# Patient Record
Sex: Male | Born: 1950 | Race: Black or African American | Hispanic: No | Marital: Married | Smoking: Former smoker
Health system: Southern US, Community
[De-identification: ages and names within clinical notes are randomized; demographics above are authoritative.]

## PROBLEM LIST (undated history)

## (undated) DIAGNOSIS — H269 Unspecified cataract: Secondary | ICD-10-CM

## (undated) DIAGNOSIS — T7840XA Allergy, unspecified, initial encounter: Secondary | ICD-10-CM

## (undated) DIAGNOSIS — I10 Essential (primary) hypertension: Secondary | ICD-10-CM

## (undated) DIAGNOSIS — E119 Type 2 diabetes mellitus without complications: Secondary | ICD-10-CM

## (undated) DIAGNOSIS — H409 Unspecified glaucoma: Secondary | ICD-10-CM

## (undated) HISTORY — DX: Unspecified cataract: H26.9

## (undated) HISTORY — DX: Unspecified glaucoma: H40.9

## (undated) HISTORY — DX: Allergy, unspecified, initial encounter: T78.40XA

---

## 2016-07-03 DIAGNOSIS — E782 Mixed hyperlipidemia: Secondary | ICD-10-CM | POA: Insufficient documentation

## 2018-04-18 ENCOUNTER — Other Ambulatory Visit: Payer: Self-pay

## 2018-04-18 ENCOUNTER — Emergency Department (HOSPITAL_BASED_OUTPATIENT_CLINIC_OR_DEPARTMENT_OTHER): Payer: Medicare HMO

## 2018-04-18 ENCOUNTER — Observation Stay (HOSPITAL_BASED_OUTPATIENT_CLINIC_OR_DEPARTMENT_OTHER)
Admission: EM | Admit: 2018-04-18 | Discharge: 2018-04-18 | Disposition: A | Discharge status: Other Acute Inpatient Hospital | Payer: Medicare HMO | Attending: Emergency Medicine | Admitting: Emergency Medicine

## 2018-04-18 ENCOUNTER — Encounter (HOSPITAL_BASED_OUTPATIENT_CLINIC_OR_DEPARTMENT_OTHER): Payer: Self-pay | Admitting: *Deleted

## 2018-04-18 DIAGNOSIS — I10 Essential (primary) hypertension: Secondary | ICD-10-CM | POA: Insufficient documentation

## 2018-04-18 DIAGNOSIS — Z87891 Personal history of nicotine dependence: Secondary | ICD-10-CM | POA: Diagnosis not present

## 2018-04-18 DIAGNOSIS — I441 Atrioventricular block, second degree: Secondary | ICD-10-CM | POA: Diagnosis not present

## 2018-04-18 DIAGNOSIS — Z880 Allergy status to penicillin: Secondary | ICD-10-CM | POA: Insufficient documentation

## 2018-04-18 DIAGNOSIS — Z7984 Long term (current) use of oral hypoglycemic drugs: Secondary | ICD-10-CM | POA: Diagnosis not present

## 2018-04-18 DIAGNOSIS — E119 Type 2 diabetes mellitus without complications: Secondary | ICD-10-CM | POA: Insufficient documentation

## 2018-04-18 DIAGNOSIS — R1031 Right lower quadrant pain: Secondary | ICD-10-CM | POA: Diagnosis present

## 2018-04-18 DIAGNOSIS — Z79899 Other long term (current) drug therapy: Secondary | ICD-10-CM | POA: Insufficient documentation

## 2018-04-18 DIAGNOSIS — R001 Bradycardia, unspecified: Secondary | ICD-10-CM | POA: Diagnosis present

## 2018-04-18 DIAGNOSIS — N452 Orchitis: Secondary | ICD-10-CM | POA: Diagnosis not present

## 2018-04-18 HISTORY — DX: Essential (primary) hypertension: I10

## 2018-04-18 HISTORY — DX: Type 2 diabetes mellitus without complications: E11.9

## 2018-04-18 LAB — BASIC METABOLIC PANEL
ANION GAP: 5 (ref 5–15)
BUN: 11 mg/dL (ref 8–23)
CO2: 29 mmol/L (ref 22–32)
Calcium: 9.6 mg/dL (ref 8.9–10.3)
Chloride: 103 mmol/L (ref 98–111)
Creatinine, Ser: 1.11 mg/dL (ref 0.61–1.24)
GFR calc Af Amer: >60 mL/min (ref >60)
GFR calc non Af Amer: >60 mL/min (ref >60)
Glucose, Bld: 113 mg/dL — ABNORMAL HIGH (ref 70–99)
Potassium: 3.9 mmol/L (ref 3.5–5.1)
Sodium: 137 mmol/L (ref 135–145)

## 2018-04-18 LAB — URINALYSIS, ROUTINE W REFLEX MICROSCOPIC
Bilirubin Urine: NEGATIVE
Glucose, UA: NEGATIVE mg/dL
Hgb urine dipstick: NEGATIVE
Ketones, ur: NEGATIVE mg/dL
Nitrite: NEGATIVE
Protein, ur: NEGATIVE mg/dL
pH: 6.5 (ref 5.0–8.0)

## 2018-04-18 LAB — URINALYSIS, MICROSCOPIC (REFLEX)

## 2018-04-18 LAB — CBC
HEMATOCRIT: 37.2 % — AB (ref 39.0–52.0)
Hemoglobin: 12 g/dL — ABNORMAL LOW (ref 13.0–17.0)
MCH: 28.8 pg (ref 26.0–34.0)
MCHC: 32.3 g/dL (ref 30.0–36.0)
MCV: 89.4 fL (ref 80.0–100.0)
Platelets: 237 10*3/uL (ref 150–400)
RBC: 4.16 MIL/uL — AB (ref 4.22–5.81)
RDW: 13.5 % (ref 11.5–15.5)
WBC: 4.8 10*3/uL (ref 4.0–10.5)
nRBC: 0 % (ref 0.0–0.2)

## 2018-04-18 LAB — TROPONIN I: Troponin I: <0.03 ng/mL (ref <0.03)

## 2018-04-18 MED ORDER — CIPROFLOXACIN HCL 500 MG PO TABS
500.0000 mg | ORAL_TABLET | Freq: Once | ORAL | Status: AC
  Administered 2018-04-18: 500 mg via ORAL
  Filled 2018-04-18: qty 1

## 2018-04-18 NOTE — ED Triage Notes (Addendum)
Pt sent here from [facility] for evaluation of "slow heart rate". Pt alert and no distress. HR 30-40s in triage. Denies chest pain. States only pain is in his Right groin x 2 weeks which is why he went to the doctor. States he was evaluated for this pain but was sent here after they did his vital signs. Pt ambulated to triage with quick steady gait.

## 2018-04-18 NOTE — ED Notes (Signed)
ED Provider at bedside. 

## 2018-04-18 NOTE — ED Provider Notes (Signed)
[facility] EMERGENCY DEPARTMENT Provider Note   CSN: [id number] Arrival date & time: 04/18/18  1410     History   Chief Complaint Chief Complaint  Patient presents with  . Bradycardia    HPI Barry Gonzalez is a 67 y.o. male.  Pt presents to the ED today with bradycardia and groin pain.  Pt said he has had bilateral groin pain for about 2 weeks.  [facility] for that [facility].  While he was there, he was noted to be quite bradycardic.  He did not realize this and is asymptomatic from it.  He denies cp or dizziness.  He is on norvasc 5 mg and altace 10 mg daily.  No beta blockers.  He has also noticed some groin swelling and lower extremity swelling.     Past Medical History:  Diagnosis Date  . Diabetes mellitus without complication (HCC)   . Hypertension     Patient Active Problem List   Diagnosis Date Noted  . Bradycardia 04/18/2018    History reviewed. No pertinent surgical history.      Home Medications    Prior to Admission medications   Medication Sig Start Date End Date Taking? Authorizing Provider  amLODipine (NORVASC) 5 MG tablet Take 5 mg by mouth daily.   Yes [provider]  metFORMIN (GLUCOPHAGE) 500 MG tablet Take by mouth 2 (two) times daily with a meal.   Yes [provider]  ramipril (ALTACE) 10 MG capsule Take 10 mg by mouth daily.   Yes [provider]    Family History No family history on file.  Social History Social History   Tobacco Use  . Smoking status: Former Games developermoker  . Smokeless tobacco: Never Used  Substance Use Topics  . Alcohol use: Never    Frequency: Never  . Drug use: Never     Allergies   Penicillins   Review of Systems Review of Systems  Cardiovascular:       Bradycardia  Genitourinary: Positive for scrotal swelling.  All other systems reviewed and are negative.    Physical Exam Updated Vital Signs BP 138/62 (BP Location: Left Arm)   Pulse (!) 38    Temp 98.3 F (36.8 C) (Oral)   Resp 18   Ht 5\' 6"  (1.676 m)   Wt 70.8 kg   SpO2 100%   BMI 25.18 kg/m   Physical Exam Vitals signs and nursing note reviewed. Exam conducted with a chaperone present.  Constitutional:      Appearance: Normal appearance. He is normal weight.  HENT:     Head: Normocephalic and atraumatic.     Right Ear: External ear normal.     Left Ear: External ear normal.     Nose: Nose normal.     Mouth/Throat:     Mouth: Mucous membranes are moist.     Pharynx: Oropharynx is clear.  Eyes:     Extraocular Movements: Extraocular movements intact.     Conjunctiva/sclera: Conjunctivae normal.     Pupils: Pupils are equal, round, and reactive to light.  Neck:     Musculoskeletal: Normal range of motion.  Cardiovascular:     Rate and Rhythm: Bradycardia present.     Pulses: Normal pulses.     Heart sounds: Normal heart sounds.  Pulmonary:     Effort: Pulmonary effort is normal.     Breath sounds: Normal breath sounds.  Abdominal:     General: Abdomen is flat.  Genitourinary:  Penis: Normal.      Scrotum/Testes:        Right: Tenderness and swelling present.        Left: Tenderness and swelling present.  Musculoskeletal:        General: Swelling present.  Skin:    General: Skin is warm.     Capillary Refill: Capillary refill takes less than 2 seconds.  Neurological:     General: No focal deficit present.     Mental Status: He is alert and oriented to person, place, and time.  Psychiatric:        Mood and Affect: Mood normal.      ED Treatments / Results  Labs (all labs ordered are listed, but only abnormal results are displayed) Labs Reviewed  BASIC METABOLIC PANEL - Abnormal; Notable for the following components:      Result Value   Glucose, Bld 113 (*)    All other components within normal limits  CBC - Abnormal; Notable for the following components:   RBC 4.16 (*)    Hemoglobin 12.0 (*)    HCT 37.2 (*)    All other components within  normal limits  URINALYSIS, ROUTINE W REFLEX MICROSCOPIC - Abnormal; Notable for the following components:   Specific Gravity, Urine <1.005 (*)    Leukocytes, UA SMALL (*)    All other components within normal limits  URINALYSIS, MICROSCOPIC (REFLEX) - Abnormal; Notable for the following components:   Bacteria, UA FEW (*)    All other components within normal limits  TROPONIN I    EKG EKG Interpretation  Date/Time:  Saturday April 18 2018 14:17:16 EST Ventricular Rate:  43 PR Interval:    QRS Duration: 84 QT Interval:  384 QTC Calculation: 324 R Axis:   43 Text Interpretation:  Second degree heart block Confirmed by Jacalyn LefevreHaviland, Chandrea Zellman [phone]([id number]) on 04/18/2018 3:01:10 PM   Radiology [place] Scrotum W/doppler  Result Date: 04/18/2018 CLINICAL DATA:  Bilateral testicular pain and swelling EXAM: SCROTAL ULTRASOUND DOPPLER ULTRASOUND OF THE TESTICLES TECHNIQUE: Complete ultrasound examination of the testicles, epididymis, and other scrotal structures was performed. Color and spectral Doppler ultrasound were also utilized to evaluate blood flow to the testicles. COMPARISON:  None. FINDINGS: Right testicle Measurements: 3.9 x 2.4 x 3 cm. No mass or microlithiasis visualized. Left testicle Measurements: 4 x 2.2 x 2.4 cm. No mass or microlithiasis visualized. Right epididymis: Slightly hypervascular. 5 mm right epididymal cyst. Left epididymis:  2 small cysts measuring 4 mm Hydrocele: Bilateral hydroceles, moderate on the right and small on the left both with particulate debris. Varicocele:  Borderline to small right varicocele. Pulsed Doppler interrogation of both testes demonstrates normal low resistance arterial and venous waveforms bilaterally. Slightly hyperemic right testis IMPRESSION: 1. Negative for acute testicular torsion 2. Slightly hyperemic right testis and epididymis suggesting mild epididymal orchitis 3. Bilateral slightly complex hydroceles, moderate on the right and small on the left  Electronically Signed   By: Jasmine PangKim  Fujinaga M.D.   On: 04/18/2018 16:09    Procedures Procedures (including critical care time)  Medications Ordered in ED Medications  ciprofloxacin (CIPRO) tablet 500 mg (has no administration in time range)     Initial Impression / Assessment and Plan / ED Course  I have reviewed the triage vital signs and the nursing notes.  Pertinent labs & imaging results that were available during my care of the patient were reviewed by me and considered in my medical decision making (see chart for details).    Pt  d/w Dr. Wyline Mood (cardiology) who recommended admission for observation to make sure pt is not in a complete heart block.  Unfortunately, pt wanted to go to [facility].  The wife does not like driving on the highway and was worried about getting to [facility].    I spoke with Dr. Reuel Boom, cardiology at [facility] who accepted pt for transfer.    UA looks nl, but pt does have some mild inflammation to his right testicle c/w orchitis.  Pt will be started on Cipro.    CRITICAL CARE Performed by: Jacalyn Lefevre   Total critical care time: 45 minutes  Critical care time was exclusive of separately billable procedures and treating other patients.  Critical care was necessary to treat or prevent imminent or life-threatening deterioration.  Critical care was time spent personally by me on the following activities: development of treatment plan with patient and/or surrogate as well as nursing, discussions with consultants, evaluation of patient's response to treatment, examination of patient, obtaining history from patient or surrogate, ordering and performing treatments and interventions, ordering and review of laboratory studies, ordering and review of radiographic studies, pulse oximetry and re-evaluation of patient's condition.   Final Clinical Impressions(s) / ED Diagnoses   Final diagnoses:  Second degree heart block  Orchitis    ED Discharge Orders    None         Jacalyn Lefevre, MD 04/18/18 1801

## 2018-04-18 NOTE — ED Notes (Addendum)
[facility], [facility]

## 2018-04-18 NOTE — ED Notes (Signed)
Patient transported to Ultrasound 

## 2018-04-18 NOTE — ED Notes (Signed)
Pt requesting transfer to [facility]. MD aware

## 2018-04-18 NOTE — ED Notes (Signed)
Pt Calm alert, denies sob or chest pain. Skin warm and dry

## 2018-04-18 NOTE — ED Notes (Signed)
Skin warm and dry. Pt calmly texting on phone on stretcher, denies SOB. Or chest pain

## 2019-12-20 IMAGING — US US SCROTUM W/ DOPPLER COMPLETE
1 series · 13 of 25 positions shown · non-contrast
Comparison: None.

CLINICAL DATA: Bilateral testicular pain and swelling

EXAM:
SCROTAL ULTRASOUND
DOPPLER ULTRASOUND OF THE TESTICLES
TECHNIQUE: Complete ultrasound examination of the testicles, epididymis, and
other scrotal structures was performed. Color and spectral Doppler
ultrasound were also utilized to evaluate blood flow to the
testicles.

[Series 1: us scrotum w/ doppler complete · 0.07mm/px · 13 of 69 slices shown]
[im 1/69]
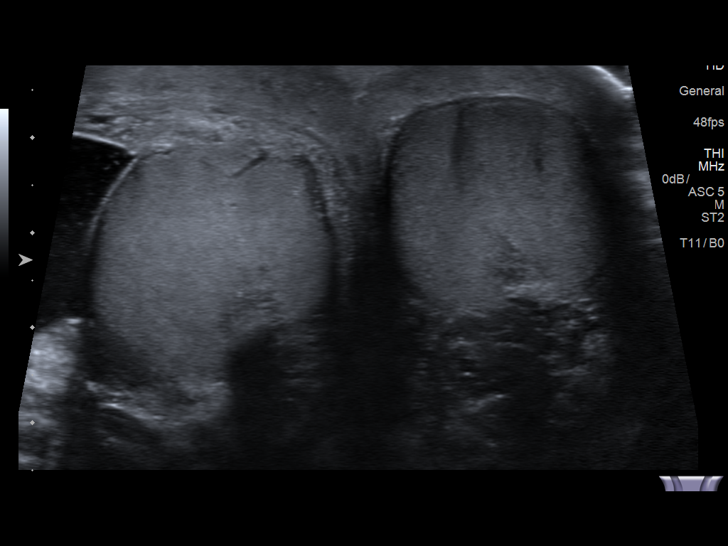
[im 6/69]
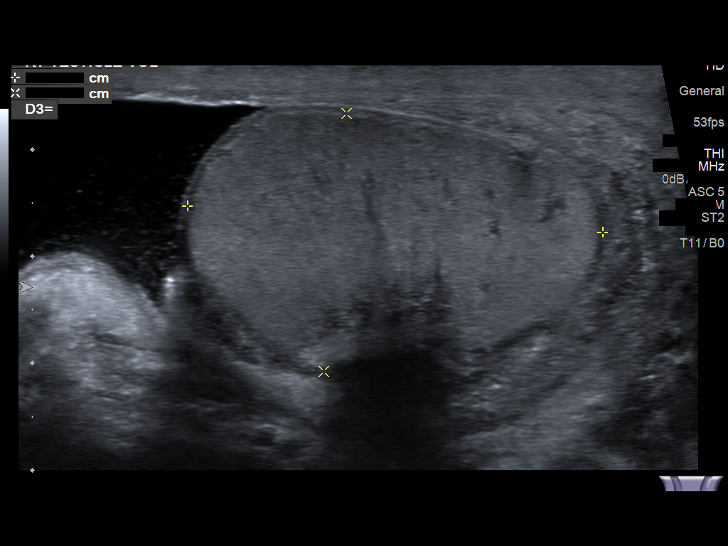
[im 12/69]
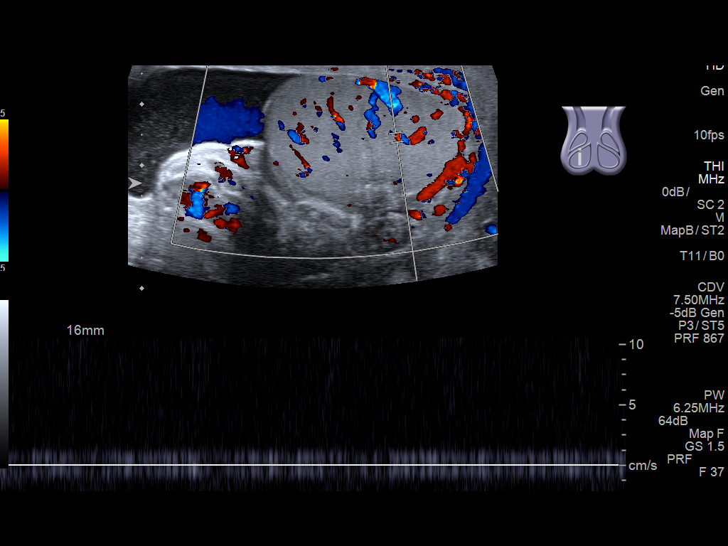
[im 18/69]
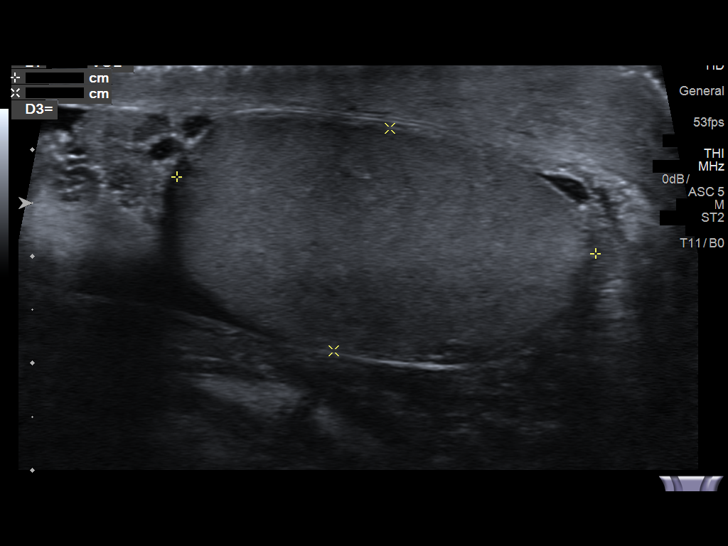
[im 23/69]
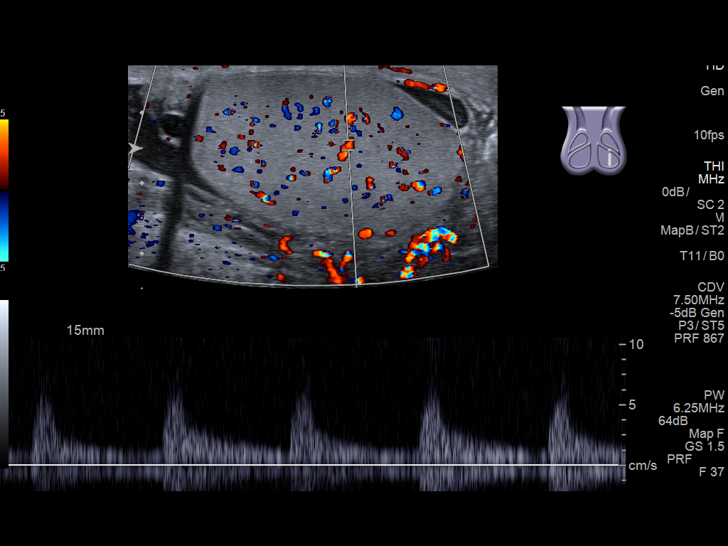
[im 29/69]
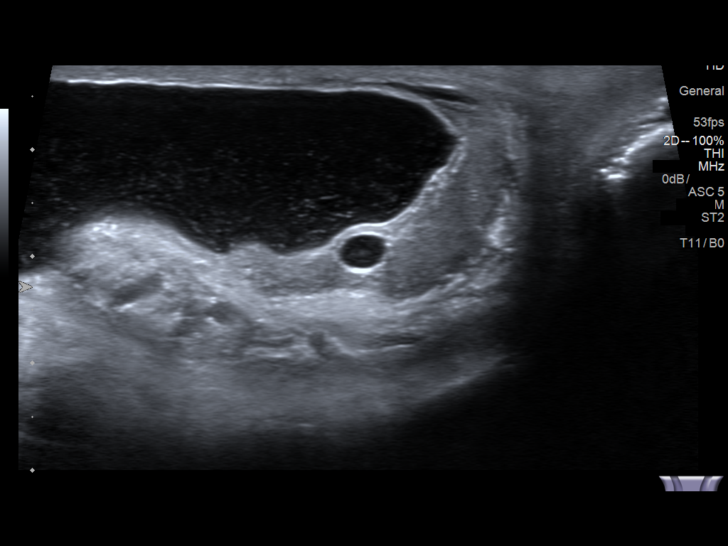
[im 35/69]
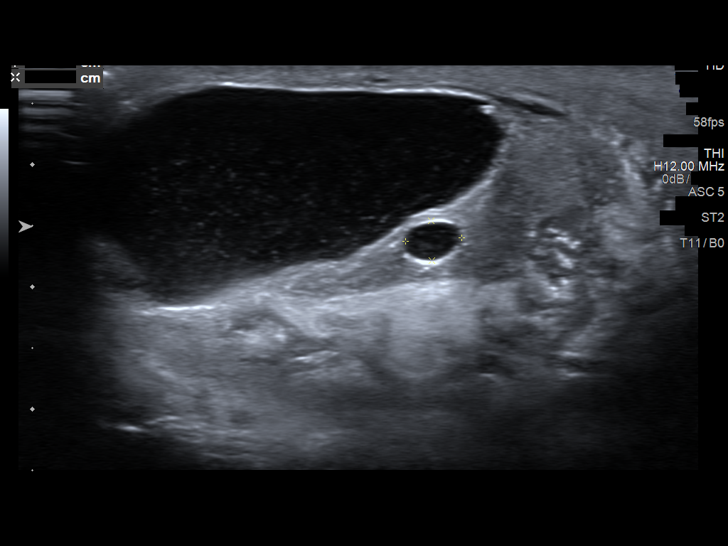
[im 40/69]
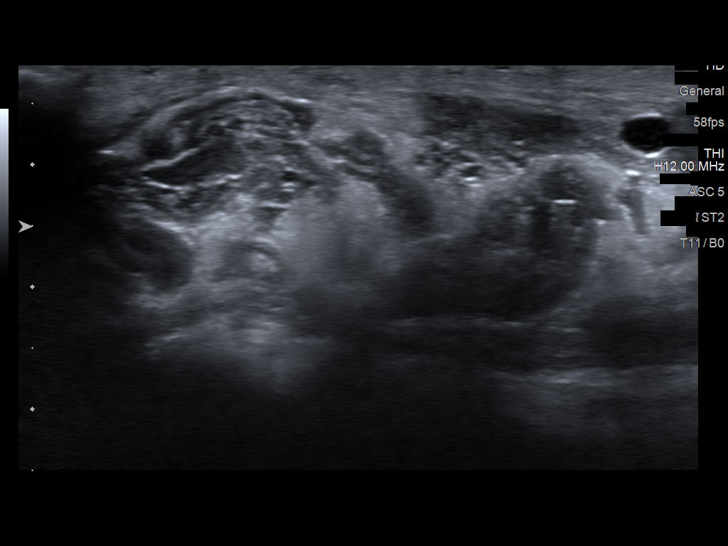
[im 46/69]
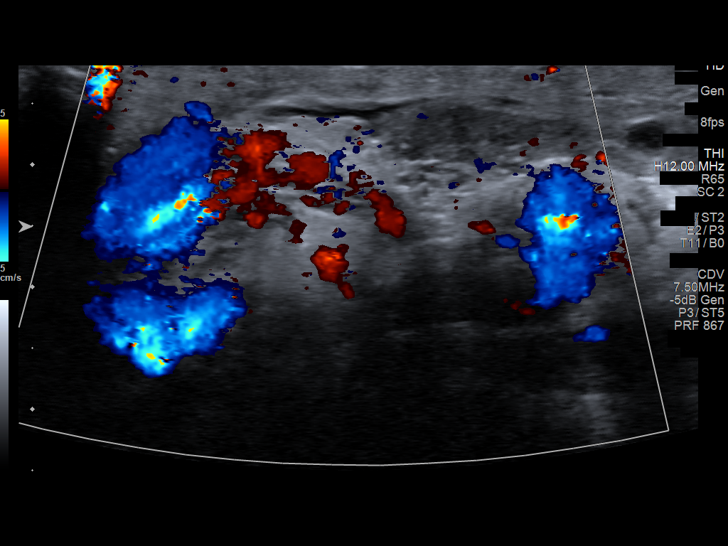
[im 52/69]
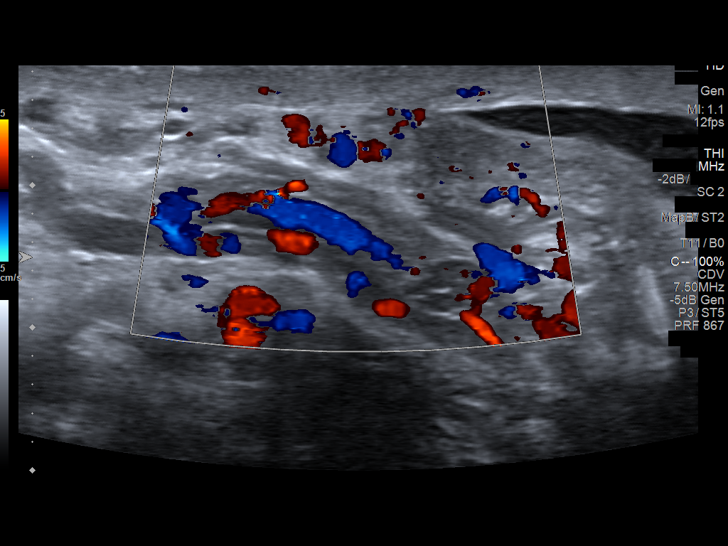
[im 57/69]
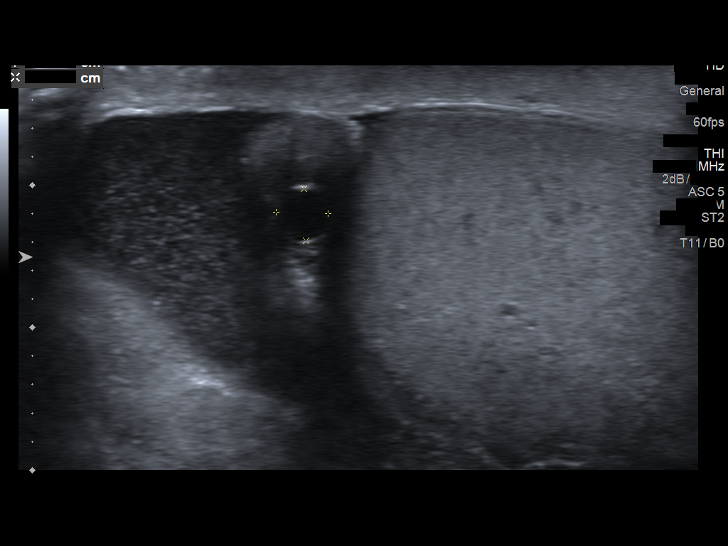
[im 63/69]
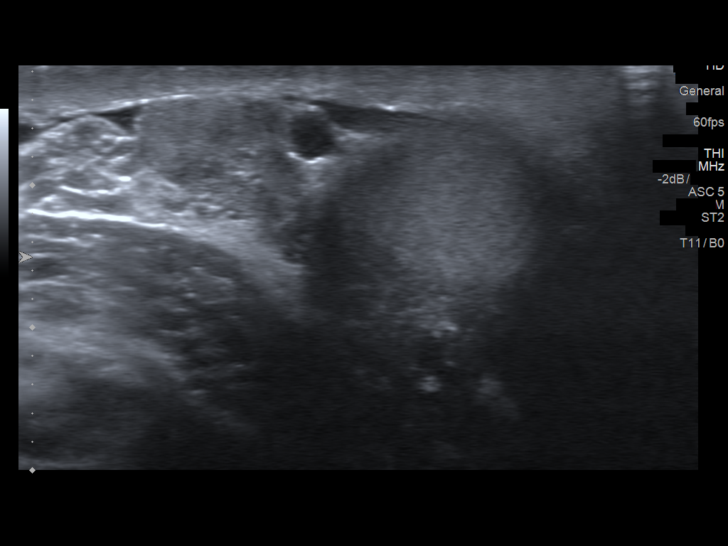
[im 69/69]
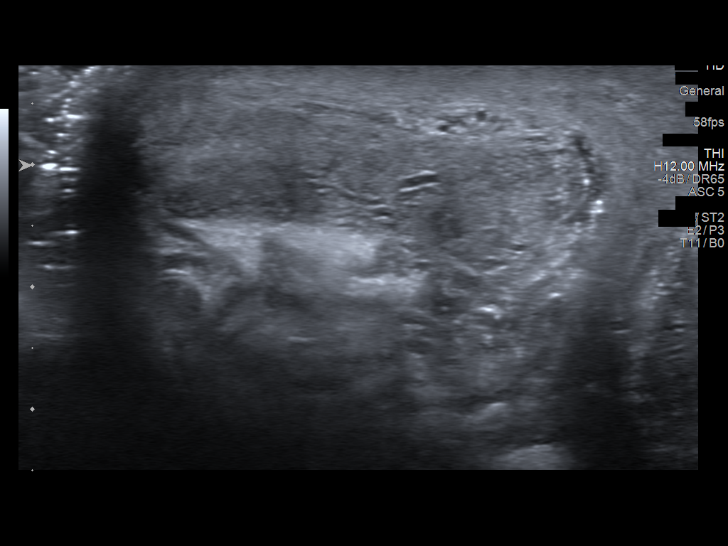

[13 of 25 positions shown; findings below may reference images not displayed]

FINDINGS: Right testicle

Measurements: 3.9 x 2.4 x 3 cm. No mass or microlithiasis
visualized.

Left testicle

Measurements: 4 x 2.2 x 2.4 cm. No mass or microlithiasis
visualized.

Right epididymis: Slightly hypervascular. 5 mm right epididymal
cyst.

Left epididymis:  2 small cysts measuring 4 mm

Hydrocele: Bilateral hydroceles, moderate on the right and small on
the left both with particulate debris.

Varicocele:  Borderline to small right varicocele.

Pulsed Doppler interrogation of both testes demonstrates normal low
resistance arterial and venous waveforms bilaterally. Slightly
hyperemic right testis
IMPRESSION: 1. Negative for acute testicular torsion
2. Slightly hyperemic right testis and epididymis suggesting mild
epididymal orchitis
3. Bilateral slightly complex hydroceles, moderate on the right and
small on the left

## 2020-04-10 DIAGNOSIS — I77810 Thoracic aortic ectasia: Secondary | ICD-10-CM | POA: Insufficient documentation

## 2020-06-20 ENCOUNTER — Ambulatory Visit: Payer: Medicare HMO | Admitting: Family Medicine

## 2020-06-23 ENCOUNTER — Ambulatory Visit: Payer: Medicare HMO | Admitting: Family Medicine

## 2020-06-29 ENCOUNTER — Other Ambulatory Visit: Payer: Self-pay

## 2020-06-29 ENCOUNTER — Ambulatory Visit (INDEPENDENT_AMBULATORY_CARE_PROVIDER_SITE_OTHER): Payer: Medicare HMO | Admitting: Family Medicine

## 2020-06-29 ENCOUNTER — Encounter: Payer: Self-pay | Admitting: Family Medicine

## 2020-06-29 VITALS — BP 143/60 | HR 32 | Temp 98.1°F | Ht 66.0 in | Wt 149.0 lb

## 2020-06-29 DIAGNOSIS — E119 Type 2 diabetes mellitus without complications: Secondary | ICD-10-CM

## 2020-06-29 DIAGNOSIS — R609 Edema, unspecified: Secondary | ICD-10-CM

## 2020-06-29 DIAGNOSIS — I441 Atrioventricular block, second degree: Secondary | ICD-10-CM | POA: Insufficient documentation

## 2020-06-29 DIAGNOSIS — I10 Essential (primary) hypertension: Secondary | ICD-10-CM | POA: Insufficient documentation

## 2020-06-29 MED ORDER — ROSUVASTATIN CALCIUM 10 MG PO TABS: 10.0000 mg | ORAL_TABLET | Freq: Every day | ORAL | 2 refills | Status: DC

## 2020-06-29 MED ORDER — METFORMIN HCL 500 MG PO TABS: 500.0000 mg | ORAL_TABLET | Freq: Two times a day (BID) | ORAL | 2 refills | Status: DC

## 2020-06-29 MED ORDER — AMLODIPINE BESYLATE 5 MG PO TABS: 5.0000 mg | ORAL_TABLET | Freq: Every day | ORAL | 2 refills | Status: DC

## 2020-06-29 MED ORDER — RAMIPRIL 10 MG PO CAPS: 10.0000 mg | ORAL_CAPSULE | Freq: Every day | ORAL | 2 refills | Status: DC

## 2020-06-29 NOTE — Assessment & Plan Note (Signed)
Tolerating metformin well.  Update A1c.

## 2020-06-29 NOTE — Assessment & Plan Note (Signed)
[place] ordered for further evaluation.  Discussed that CT may be needed if [place] is inconclusive.

## 2020-06-29 NOTE — Assessment & Plan Note (Signed)
Stable and asymptomatic at this time.  He will continue to see cardiology for management.

## 2020-06-29 NOTE — Patient Instructions (Signed)
Great to see you! Stop downstairs to have labs completed.  You will be contacted to set up an ultrasound.

## 2020-06-29 NOTE — Progress Notes (Signed)
Barry Gonzalez - 70 y.o. male MRN [id number]  Date of birth: [birth year]  Subjective Chief Complaint  Patient presents with  . Establish Care    HPI Barry Gonzalez is a 70 y.o. male here today for initial visit. He has a history of HTN, T2DM, 2nd Degree heart block with bradycardia and HLD.   He has additional concern today of submandibular swelling with swelling under his tongue.  First noticed this about 6 months ago.  Swelling worsens when eating and improves afterwards.  He has noticed a white spot under his tongue as well.   He sees cardiology for management of bradycardia 2/2 to 2nd degree AV block.  He denies symptoms related to this and has declined pacemaker previously.   BP is managed with amlodipine and ramipril.  Doing well with current medications.  BP has been well controlled with this historically and readings at home have looked good.  He denies chest pain,. Sob, palpitations, headache or vision changes.    Diabetes has remained well controlled with metformin.  No side effects related to medication. He has been following  A healthy diet.    ROS:  A comprehensive ROS was completed and negative except as noted per HPI  Allergies  Allergen Reactions  . Penicillins Nausea And Vomiting    Past Medical History:  Diagnosis Date  . Diabetes mellitus without complication (HCC)   . Hypertension     History reviewed. No pertinent surgical history.  Social History   Socioeconomic History  . Marital status: Married    Spouse name: Not on file  . Number of children: Not on file  . Years of education: Not on file  . Highest education level: Not on file  Occupational History  . Not on file  Tobacco Use  . Smoking status: Former Games developer  . Smokeless tobacco: Never Used  Vaping Use  . Vaping Use: Never used  Substance and Sexual Activity  . Alcohol use: Never  . Drug use: Never  . Sexual activity: Yes    Partners: Female  Other Topics Concern  . Not on file   Social History Narrative  . Not on file   Social Determinants of Health   Financial Resource Strain: Not on file  Food Insecurity: Not on file  Transportation Needs: Not on file  Physical Activity: Not on file  Stress: Not on file  Social Connections: Not on file    Family History  Problem Relation Age of Onset  . Hypertension Other   . Stroke Other     Health Maintenance  Topic Date Due  . Hepatitis C Screening  Never done  . COLONOSCOPY (Pts 45-79yrs Insurance coverage will need to be confirmed)  Never done  . PNA vac Low Risk Adult (2 of 2 - PPSV23) 06/06/2016  . TETANUS/TDAP  10/27/2020  . INFLUENZA VACCINE  Completed  . COVID-19 Vaccine  Completed  . HPV VACCINES  Aged Out     ----------------------------------------------------------------------------------------------------------------------------------------------------------------------------------------------------------------- Physical Exam BP (!) 173/60 (BP Location: Left Arm, Patient Position: Sitting, Cuff Size: Normal)   Pulse (!) 32   Temp 98.1 F (36.7 C)   Ht 5\' 6"  (1.676 m)   Wt 149 lb (67.6 kg)   SpO2 100%   BMI 24.05 kg/m   Physical Exam Constitutional:      Appearance: Normal appearance.  HENT:     Mouth/Throat:     Comments: R submandibular swelling.  No significant tenderness.   Eyes:     General: No  scleral icterus. Cardiovascular:     Rate and Rhythm: Normal rate and regular rhythm.  Pulmonary:     Effort: Pulmonary effort is normal.     Breath sounds: Normal breath sounds.  Musculoskeletal:     Cervical back: Neck supple.  Neurological:     General: No focal deficit present.     Mental Status: He is alert.  Psychiatric:        Mood and Affect: Mood normal.        Behavior: Behavior normal.      ------------------------------------------------------------------------------------------------------------------------------------------------------------------------------------------------------------------- Assessment and Plan  2nd degree AV block Stable and asymptomatic at this time.  He will continue to see cardiology for management.    Essential hypertension BP remains fairly well controlled.  Continue ramipril and amlodipine.  Rx renewed.  Update labs.  T2DM (type 2 diabetes mellitus) (HCC) Tolerating metformin well.  Update A1c.   Submandibular gland swelling [place] ordered for further evaluation.  Discussed that CT may be needed if [place] is inconclusive.    Meds ordered this encounter  Medications  . amLODipine (NORVASC) 5 MG tablet    Sig: Take 1 tablet (5 mg total) by mouth daily.    Dispense:  90 tablet    Refill:  2  . metFORMIN (GLUCOPHAGE) 500 MG tablet    Sig: Take 1 tablet (500 mg total) by mouth 2 (two) times daily with a meal.    Dispense:  180 tablet    Refill:  2  . ramipril (ALTACE) 10 MG capsule    Sig: Take 1 capsule (10 mg total) by mouth daily.    Dispense:  90 capsule    Refill:  2  . rosuvastatin (CRESTOR) 10 MG tablet    Sig: Take 1 tablet (10 mg total) by mouth daily.    Dispense:  90 tablet    Refill:  2    No follow-ups on file.    This visit occurred during the SARS-CoV-2 public health emergency.  Safety protocols were in place, including screening questions prior to the visit, additional usage of staff PPE, and extensive cleaning of exam room while observing appropriate contact time as indicated for disinfecting solutions.

## 2020-06-29 NOTE — Assessment & Plan Note (Signed)
BP remains fairly well controlled.  Continue ramipril and amlodipine.  Rx renewed.  Update labs.

## 2020-06-30 LAB — CBC WITH DIFFERENTIAL/PLATELET
Absolute Monocytes: 747 cells/uL (ref 200–950)
Basophils Absolute: 32 cells/uL (ref 0–200)
Basophils Relative: 0.6 %
Eosinophils Absolute: 90 cells/uL (ref 15–500)
Eosinophils Relative: 1.7 %
HCT: 39.9 % (ref 38.5–50.0)
Hemoglobin: 13.4 g/dL (ref 13.2–17.1)
Lymphs Abs: 1903 cells/uL (ref 850–3900)
MCH: 30.6 pg (ref 27.0–33.0)
MCHC: 33.6 g/dL (ref 32.0–36.0)
MCV: 91.1 fL (ref 80.0–100.0)
MPV: 11.3 fL (ref 7.5–12.5)
Monocytes Relative: 14.1 %
Neutro Abs: 2528 cells/uL (ref 1500–7800)
Neutrophils Relative %: 47.7 %
Platelets: 155 10*3/uL (ref 140–400)
RBC: 4.38 10*6/uL (ref 4.20–5.80)
RDW: 14.6 % (ref 11.0–15.0)
Total Lymphocyte: 35.9 %
WBC: 5.3 10*3/uL (ref 3.8–10.8)

## 2020-06-30 LAB — COMPLETE METABOLIC PANEL WITH GFR
AG Ratio: 1.7 (calc) (ref 1.0–2.5)
ALT: 10 U/L (ref 9–46)
AST: 12 U/L (ref 10–35)
Albumin: 4.3 g/dL (ref 3.6–5.1)
Alkaline phosphatase (APISO): 45 U/L (ref 35–144)
BUN: 12 mg/dL (ref 7–25)
CO2: 30 mmol/L (ref 20–32)
Calcium: 9.8 mg/dL (ref 8.6–10.3)
Chloride: 106 mmol/L (ref 98–110)
Creat: 0.97 mg/dL (ref 0.70–1.18)
GFR, Est African American: 91 mL/min/{1.73_m2} (ref >=60)
GFR, Est Non African American: 79 mL/min/{1.73_m2} (ref >=60)
Globulin: 2.6 g/dL (calc) (ref 1.9–3.7)
Glucose, Bld: 96 mg/dL (ref 65–99)
Potassium: 4.3 mmol/L (ref 3.5–5.3)
Sodium: 142 mmol/L (ref 135–146)
Total Bilirubin: 0.6 mg/dL (ref 0.2–1.2)
Total Protein: 6.9 g/dL (ref 6.1–8.1)

## 2020-06-30 LAB — HEMOGLOBIN A1C
Hgb A1c MFr Bld: 6.3 % of total Hgb — ABNORMAL HIGH (ref <5.7)
Mean Plasma Glucose: 134 mg/dL
eAG (mmol/L): 7.4 mmol/L

## 2020-07-14 ENCOUNTER — Ambulatory Visit (INDEPENDENT_AMBULATORY_CARE_PROVIDER_SITE_OTHER): Payer: Medicare HMO

## 2020-07-14 ENCOUNTER — Other Ambulatory Visit: Payer: Self-pay

## 2020-07-14 DIAGNOSIS — R590 Localized enlarged lymph nodes: Secondary | ICD-10-CM

## 2020-07-14 DIAGNOSIS — R609 Edema, unspecified: Secondary | ICD-10-CM

## 2020-07-17 ENCOUNTER — Other Ambulatory Visit: Payer: Self-pay | Admitting: Family Medicine

## 2020-07-17 DIAGNOSIS — R220 Localized swelling, mass and lump, head: Secondary | ICD-10-CM

## 2020-07-17 DIAGNOSIS — R221 Localized swelling, mass and lump, neck: Secondary | ICD-10-CM

## 2020-07-27 ENCOUNTER — Other Ambulatory Visit: Payer: Self-pay | Admitting: Family Medicine

## 2020-07-27 ENCOUNTER — Telehealth: Payer: Self-pay | Admitting: Radiology

## 2020-07-27 DIAGNOSIS — I10 Essential (primary) hypertension: Secondary | ICD-10-CM

## 2020-07-27 NOTE — Telephone Encounter (Signed)
He needs updated BMP to have CT completed.  Please have him stop by the lab.

## 2020-07-27 NOTE — Telephone Encounter (Signed)
Patient was advised of labs for scan. He states scan is unnecessary. He was able to remove the object and the facial swelling has decreased/resolved.

## 2020-07-27 NOTE — Telephone Encounter (Signed)
Left message with spouse for patient callback.

## 2020-07-27 NOTE — Telephone Encounter (Signed)
Pt needs creatinine before his CT Soft Tissue Neck with contrast can be scheduled. He can only be scheduled on Mondays @ [facility] per Medicare requirements and his most recent labs will expire before the next available Monday.

## 2020-12-26 ENCOUNTER — Telehealth: Payer: Self-pay | Admitting: Family Medicine

## 2020-12-26 NOTE — Telephone Encounter (Signed)
Copied from CRM [id number]. Topic: Medicare AWV >> Dec 26, 2020  5:44 PM Claudette Laws R wrote: Reason for CRM:  Left message for patient to call back and schedule their Medicare Annual Wellness Visit (AWV) either virtually or by telephone.  Please schedule at any time with [facility] Nurse Health Advisor. Saturdays are available to schedule in the month of September.   No history of AWV: eligible as of 04/29/2018 AWVI per [place]   40-minute appointment   Any questions, please contact me at [phone]

## 2021-04-08 ENCOUNTER — Other Ambulatory Visit: Payer: Self-pay | Admitting: Family Medicine

## 2021-04-10 NOTE — Telephone Encounter (Signed)
Spoke with wife and wife stated she would have patient call back to get this f/u appt scheduled with Dr Ashley Royalty.

## 2021-04-10 NOTE — Telephone Encounter (Signed)
Please call pt to schedule appt for diabetes follow-up.   Sending 30 day refill only. No additional refills.   Thanks

## 2021-05-15 ENCOUNTER — Ambulatory Visit: Payer: Medicare HMO | Admitting: Family Medicine

## 2021-05-25 ENCOUNTER — Ambulatory Visit (INDEPENDENT_AMBULATORY_CARE_PROVIDER_SITE_OTHER): Payer: Medicare HMO | Admitting: Family Medicine

## 2021-05-25 ENCOUNTER — Other Ambulatory Visit: Payer: Self-pay

## 2021-05-25 ENCOUNTER — Encounter: Payer: Self-pay | Admitting: Family Medicine

## 2021-05-25 VITALS — BP 140/80 | HR 30 | Ht 66.0 in | Wt 154.0 lb

## 2021-05-25 DIAGNOSIS — R001 Bradycardia, unspecified: Secondary | ICD-10-CM | POA: Diagnosis not present

## 2021-05-25 DIAGNOSIS — E782 Mixed hyperlipidemia: Secondary | ICD-10-CM

## 2021-05-25 DIAGNOSIS — E119 Type 2 diabetes mellitus without complications: Secondary | ICD-10-CM | POA: Diagnosis not present

## 2021-05-25 DIAGNOSIS — I10 Essential (primary) hypertension: Secondary | ICD-10-CM

## 2021-05-25 MED ORDER — RAMIPRIL 10 MG PO CAPS: 10.0000 mg | ORAL_CAPSULE | Freq: Every day | ORAL | 3 refills | Status: DC

## 2021-05-25 MED ORDER — METFORMIN HCL 500 MG PO TABS: 500.0000 mg | ORAL_TABLET | Freq: Two times a day (BID) | ORAL | 3 refills | Status: DC

## 2021-05-25 MED ORDER — TRIAMCINOLONE ACETONIDE 0.1 % EX CREA: 1.0000 "application " | TOPICAL_CREAM | Freq: Two times a day (BID) | CUTANEOUS | 1 refills | Status: DC

## 2021-05-25 MED ORDER — AMLODIPINE BESYLATE 5 MG PO TABS: 5.0000 mg | ORAL_TABLET | Freq: Every day | ORAL | 3 refills | Status: DC

## 2021-05-25 MED ORDER — ROSUVASTATIN CALCIUM 10 MG PO TABS: 10.0000 mg | ORAL_TABLET | Freq: Every day | ORAL | 3 refills | Status: DC

## 2021-05-27 NOTE — Progress Notes (Signed)
Barry Gonzalez - 71 y.o. male MRN [id number]  Date of birth: 03-02-1951  Subjective Chief Complaint  Patient presents with   Diabetes    HPI Barry Gonzalez is a 71 year old male here today for follow-up visit.  Reports that overall he is doing well.  Blood sugars at home have been well controlled.  He continues to tolerate metformin well.  Blood pressures have been fairly well controlled with amlodipine and ramipril.  Has history of bradycardia due to second-degree AV block, however is asymptomatic with this.  He has followed by cardiology.  He does see urology for BPH.  Stable symptoms with Flomax and finasteride.  ROS:  A comprehensive ROS was completed and negative except as noted per HPI Eczema Mom secret recipe Allergies  Allergen Reactions   Penicillins Nausea And Vomiting    Past Medical History:  Diagnosis Date   Diabetes mellitus without complication ([place])    Hypertension     History reviewed. No pertinent surgical history.  Social History   Socioeconomic History   Marital status: Married    Spouse name: Not on file   Number of children: Not on file   Years of education: Not on file   Highest education level: Not on file  Occupational History   Not on file  Tobacco Use   Smoking status: Former   Smokeless tobacco: Never  Vaping Use   Vaping Use: Never used  Substance and Sexual Activity   Alcohol use: Never   Drug use: Never   Sexual activity: Yes    Partners: Female  Other Topics Concern   Not on file  Social History Narrative   Not on file   Social Determinants of Health   Financial Resource Strain: Not on file  Food Insecurity: Not on file  Transportation Needs: Not on file  Physical Activity: Not on file  Stress: Not on file  Social Connections: Not on file    Family History  Problem Relation Age of Onset   Hypertension Other    Stroke Other     Health Maintenance  Topic Date Due   FOOT EXAM  Never done   OPHTHALMOLOGY EXAM  Never done    Hepatitis C Screening  Never done   COLONOSCOPY (Pts 45-76yrs Insurance coverage will need to be confirmed)  Never done   Zoster Vaccines- Shingrix (1 of 2) Never done   Pneumonia Vaccine 63+ Years old (2 - PPSV23 if available, else PCV20) 06/06/2016   COVID-19 Vaccine (4 - Booster for [place] series) 03/18/2020   TETANUS/TDAP  10/27/2020   HEMOGLOBIN A1C  12/30/2020   INFLUENZA VACCINE  Completed   HPV VACCINES  Aged Out     ----------------------------------------------------------------------------------------------------------------------------------------------------------------------------------------------------------------- Physical Exam BP 140/80 (BP Location: Left Arm, Patient Position: Sitting, Cuff Size: Small)    Pulse (!) 30    Ht 5\' 6"  (1.676 m)    Wt 154 lb (69.9 kg)    SpO2 100%    BMI 24.86 kg/m   Physical Exam Constitutional:      Appearance: Normal appearance.  Eyes:     General: No scleral icterus. Cardiovascular:     Rate and Rhythm: Regular rhythm. Bradycardia present.  Pulmonary:     Effort: Pulmonary effort is normal.     Breath sounds: Normal breath sounds.  Musculoskeletal:     Cervical back: Neck supple.  Neurological:     General: No focal deficit present.     Mental Status: He is alert.  Psychiatric:  Mood and Affect: Mood normal.        Behavior: Behavior normal.    ------------------------------------------------------------------------------------------------------------------------------------------------------------------------------------------------------------------- Assessment and Plan  Essential hypertension Blood pressure remains fairly well controlled current medications.  Recommend he continue with current medications.  T2DM (type 2 diabetes mellitus) ([place]) updating A1c today.  Doing well with metformin  Mixed hyperlipidemia Tolerating rosuvastatin well.  Update lipid panel and hepatic function.  Bradycardia Followed  by cardiology.  History of second-degree AV block without symptoms.   Meds ordered this encounter  Medications   amLODipine (NORVASC) 5 MG tablet    Sig: Take 1 tablet (5 mg total) by mouth daily.    Dispense:  90 tablet    Refill:  3   metFORMIN (GLUCOPHAGE) 500 MG tablet    Sig: Take 1 tablet (500 mg total) by mouth 2 (two) times daily with a meal.    Dispense:  180 tablet    Refill:  3   ramipril (ALTACE) 10 MG capsule    Sig: Take 1 capsule (10 mg total) by mouth daily.    Dispense:  90 capsule    Refill:  3   rosuvastatin (CRESTOR) 10 MG tablet    Sig: Take 1 tablet (10 mg total) by mouth daily.    Dispense:  90 tablet    Refill:  3   triamcinolone cream (KENALOG) 0.1 %    Sig: Apply 1 application topically 2 (two) times daily.    Dispense:  30 g    Refill:  1    Return in about 6 months (around 11/22/2021) for HTN/T2DM.    This visit occurred during the SARS-CoV-2 public health emergency.  Safety protocols were in place, including screening questions prior to the visit, additional usage of staff PPE, and extensive cleaning of exam room while observing appropriate contact time as indicated for disinfecting solutions.  Patient

## 2021-05-27 NOTE — Assessment & Plan Note (Addendum)
Followed by cardiology.  History of second-degree AV block without symptoms.

## 2021-05-27 NOTE — Assessment & Plan Note (Signed)
updating A1c today.  Doing well with metformin

## 2021-05-27 NOTE — Assessment & Plan Note (Signed)
Tolerating rosuvastatin well.  Update lipid panel and hepatic function.

## 2021-05-27 NOTE — Assessment & Plan Note (Signed)
Blood pressure remains fairly well controlled current medications.  Recommend he continue with current medications.

## 2021-06-09 LAB — CBC WITH DIFFERENTIAL/PLATELET
Absolute Monocytes: 546 cells/uL (ref 200–950)
Basophils Absolute: 39 cells/uL (ref 0–200)
Basophils Relative: 1 %
Eosinophils Absolute: 62 cells/uL (ref 15–500)
Eosinophils Relative: 1.6 %
HCT: 40.9 % (ref 38.5–50.0)
Hemoglobin: 13.7 g/dL (ref 13.2–17.1)
Lymphs Abs: 1552 cells/uL (ref 850–3900)
MCH: 30.3 pg (ref 27.0–33.0)
MCHC: 33.5 g/dL (ref 32.0–36.0)
MCV: 90.5 fL (ref 80.0–100.0)
MPV: 10.5 fL (ref 7.5–12.5)
Monocytes Relative: 14 %
Neutro Abs: 1700 cells/uL (ref 1500–7800)
Neutrophils Relative %: 43.6 %
Platelets: 142 10*3/uL (ref 140–400)
RBC: 4.52 10*6/uL (ref 4.20–5.80)
RDW: 14.5 % (ref 11.0–15.0)
Total Lymphocyte: 39.8 %
WBC: 3.9 10*3/uL (ref 3.8–10.8)

## 2021-06-09 LAB — COMPLETE METABOLIC PANEL WITH GFR
AG Ratio: 1.5 (calc) (ref 1.0–2.5)
ALT: 8 U/L — ABNORMAL LOW (ref 9–46)
AST: 13 U/L (ref 10–35)
Albumin: 3.9 g/dL (ref 3.6–5.1)
Alkaline phosphatase (APISO): 34 U/L — ABNORMAL LOW (ref 35–144)
BUN: 11 mg/dL (ref 7–25)
CO2: 28 mmol/L (ref 20–32)
Calcium: 9.8 mg/dL (ref 8.6–10.3)
Chloride: 106 mmol/L (ref 98–110)
Creat: 1.11 mg/dL (ref 0.70–1.28)
Globulin: 2.6 g/dL (calc) (ref 1.9–3.7)
Glucose, Bld: 98 mg/dL (ref 65–99)
Potassium: 4.6 mmol/L (ref 3.5–5.3)
Sodium: 140 mmol/L (ref 135–146)
Total Bilirubin: 0.7 mg/dL (ref 0.2–1.2)
Total Protein: 6.5 g/dL (ref 6.1–8.1)
eGFR: 71 mL/min/{1.73_m2} (ref >=60)

## 2021-06-09 LAB — LIPID PANEL W/REFLEX DIRECT LDL
Cholesterol: 136 mg/dL (ref <200)
HDL: 53 mg/dL (ref >=40)
LDL Cholesterol (Calc): 70 mg/dL (calc)
Non-HDL Cholesterol (Calc): 83 mg/dL (calc) (ref <130)
Total CHOL/HDL Ratio: 2.6 (calc) (ref <5.0)
Triglycerides: 45 mg/dL (ref <150)

## 2021-06-09 LAB — HEMOGLOBIN A1C
Hgb A1c MFr Bld: 6.6 % of total Hgb — ABNORMAL HIGH (ref <5.7)
Mean Plasma Glucose: 143 mg/dL
eAG (mmol/L): 7.9 mmol/L

## 2021-07-17 LAB — HM DIABETES EYE EXAM

## 2021-11-22 ENCOUNTER — Encounter: Payer: Self-pay | Admitting: Family Medicine

## 2021-11-22 ENCOUNTER — Ambulatory Visit (INDEPENDENT_AMBULATORY_CARE_PROVIDER_SITE_OTHER): Payer: Medicare HMO | Admitting: Family Medicine

## 2021-11-22 VITALS — BP 140/62 | HR 32 | Ht 66.0 in | Wt 154.0 lb

## 2021-11-22 DIAGNOSIS — I10 Essential (primary) hypertension: Secondary | ICD-10-CM | POA: Diagnosis not present

## 2021-11-22 DIAGNOSIS — N419 Inflammatory disease of prostate, unspecified: Secondary | ICD-10-CM | POA: Insufficient documentation

## 2021-11-22 DIAGNOSIS — I441 Atrioventricular block, second degree: Secondary | ICD-10-CM

## 2021-11-22 DIAGNOSIS — R350 Frequency of micturition: Secondary | ICD-10-CM | POA: Diagnosis not present

## 2021-11-22 DIAGNOSIS — N411 Chronic prostatitis: Secondary | ICD-10-CM

## 2021-11-22 DIAGNOSIS — E119 Type 2 diabetes mellitus without complications: Secondary | ICD-10-CM

## 2021-11-22 LAB — POCT URINALYSIS DIPSTICK
Bilirubin, UA: NEGATIVE
Blood, UA: NEGATIVE
Glucose, UA: NEGATIVE
Ketones, UA: NEGATIVE
Nitrite, UA: NEGATIVE
Protein, UA: NEGATIVE
Spec Grav, UA: >=1.03 — AB (ref 1.010–1.025)
Urobilinogen, UA: 0.2 E.U./dL
pH, UA: 6 (ref 5.0–8.0)

## 2021-11-22 LAB — POCT GLYCOSYLATED HEMOGLOBIN (HGB A1C): HbA1c, POC (controlled diabetic range): 6.5 % (ref 0.0–7.0)

## 2021-11-22 LAB — POCT UA - MICROALBUMIN
Albumin/Creatinine Ratio, Urine, POC: <30
Creatinine, POC: 200 mg/dL
Microalbumin Ur, POC: 30 mg/L

## 2021-11-22 MED ORDER — SULFAMETHOXAZOLE-TRIMETHOPRIM 800-160 MG PO TABS: 1.0000 | ORAL_TABLET | Freq: Two times a day (BID) | ORAL | 0 refills | Status: AC

## 2021-11-22 NOTE — Assessment & Plan Note (Signed)
A1c stable at this time.  Recommend continuation of metformin

## 2021-11-22 NOTE — Patient Instructions (Addendum)
Continue current medications.   Add trimethoprim-sulfamethoxazole.  We'll plan to treat for a minimum of 6 weeks.  Hopefully this helps resolve the recurrent urinary problems.    Please check blood pressure at home.    See me again in 6 months.

## 2021-11-22 NOTE — Assessment & Plan Note (Signed)
Blood pressure mildly elevated.  Readings at home as well as his cardiology office are better controlled.  We will continue current medications at current strength at this time.

## 2021-11-22 NOTE — Progress Notes (Signed)
Barry Gonzalez - 71 y.o. male MRN [id number]  Date of birth: February 04, 1951  Subjective No chief complaint on file.   HPI Barry Gonzalez is a 71 year old male here today for follow-up visit.  Blood pressure currently treated with amlodipine and ramipril.  Blood pressure at home has been well controlled.  Well-controlled at his cardiology appointment a couple months ago.  He is followed by cardiology for second-degree heart block.  He denies any symptoms related to this including fatigue, shortness of breath or syncope.  Continues on metformin for management of diabetes.  Tolerating this well.  He continues to have issues with urinary frequency and recurrent prostatitis.  He is followed by neurology.  He has been on Cipro a few times however symptoms returned after he completes this.  He is taking finasteride for BPH as well as tamsulosin.  ROS:  A comprehensive ROS was completed and negative except as noted per HPI  Allergies  Allergen Reactions   Penicillins Nausea And Vomiting    Past Medical History:  Diagnosis Date   Diabetes mellitus without complication (HCC)    Hypertension     History reviewed. No pertinent surgical history.  Social History   Socioeconomic History   Marital status: Married    Spouse name: Not on file   Number of children: Not on file   Years of education: Not on file   Highest education level: Not on file  Occupational History   Not on file  Tobacco Use   Smoking status: Former   Smokeless tobacco: Never  Vaping Use   Vaping Use: Never used  Substance and Sexual Activity   Alcohol use: Never   Drug use: Never   Sexual activity: Yes    Partners: Female  Other Topics Concern   Not on file  Social History Narrative   Not on file   Social Determinants of Health   Financial Resource Strain: Not on file  Food Insecurity: Not on file  Transportation Needs: Not on file  Physical Activity: Not on file  Stress: Not on file  Social Connections: Not  on file    Family History  Problem Relation Age of Onset   Hypertension Other    Stroke Other     Health Maintenance  Topic Date Due   COVID-19 Vaccine (4 - Pfizer series) 02/22/2022 (Originally 03/18/2020)   Zoster Vaccines- Shingrix (1 of 2) 02/22/2022 (Originally 06/05/2000)   Pneumonia Vaccine 9+ Years old (2 - PPSV23 or PCV20) 11/23/2022 (Originally 06/06/2016)   COLONOSCOPY (Pts 45-68yrs Insurance coverage will need to be confirmed)  11/23/2022 (Originally 06/06/1995)   TETANUS/TDAP  11/23/2022 (Originally 10/27/2020)   Hepatitis C Screening  11/23/2022 (Originally 06/05/1968)   INFLUENZA VACCINE  11/27/2021   HEMOGLOBIN A1C  05/25/2022   Diabetic kidney evaluation - GFR measurement  06/08/2022   OPHTHALMOLOGY EXAM  07/18/2022   Diabetic kidney evaluation - Urine ACR  11/23/2022   FOOT EXAM  11/23/2022   HPV VACCINES  Aged Out     ----------------------------------------------------------------------------------------------------------------------------------------------------------------------------------------------------------------- Physical Exam BP 140/62 (BP Location: Left Arm, Patient Position: Sitting, Cuff Size: Normal)   Pulse (!) 32   Ht 5\' 6"  (1.676 m)   Wt 154 lb (69.9 kg)   SpO2 98%   BMI 24.86 kg/m   Physical Exam Constitutional:      Appearance: Normal appearance.  HENT:     Head: Normocephalic and atraumatic.  Eyes:     General: No scleral icterus. Cardiovascular:     Rate and Rhythm: Normal rate  and regular rhythm.  Pulmonary:     Effort: Pulmonary effort is normal.     Breath sounds: Normal breath sounds.  Musculoskeletal:     Cervical back: Neck supple.  Neurological:     General: No focal deficit present.     Mental Status: He is alert.  Psychiatric:        Mood and Affect: Mood normal.        Behavior: Behavior normal.      ------------------------------------------------------------------------------------------------------------------------------------------------------------------------------------------------------------------- Assessment and Plan  2nd degree AV block Asymptomatic at this time.  Followed by cardiology.  Essential hypertension Blood pressure mildly elevated.  Readings at home as well as his cardiology office are better controlled.  We will continue current medications at current strength at this time.  T2DM (type 2 diabetes mellitus) (HCC) A1c stable at this time.  Recommend continuation of metformin  Prostatitis Continues to have episodes of recurrent prostatitis.  Checking urinalysis and sending for culture.  Will start Bactrim DS twice daily x6 weeks.   Meds ordered this encounter  Medications   sulfamethoxazole-trimethoprim (BACTRIM DS) 800-160 MG tablet    Sig: Take 1 tablet by mouth 2 (two) times daily.    Dispense:  84 tablet    Refill:  0    Return in about 6 months (around 05/25/2022) for T2DM/HTN.    This visit occurred during the SARS-CoV-2 public health emergency.  Safety protocols were in place, including screening questions prior to the visit, additional usage of staff PPE, and extensive cleaning of exam room while observing appropriate contact time as indicated for disinfecting solutions.

## 2021-11-22 NOTE — Assessment & Plan Note (Signed)
Asymptomatic at this time.  Followed by cardiology.

## 2021-11-22 NOTE — Assessment & Plan Note (Signed)
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  Checking urinalysis and sending for culture.  Will start Bactrim DS twice daily x6 weeks.

## 2021-11-24 LAB — URINE CULTURE
MICRO NUMBER:: 13707427
Result:: NO GROWTH
SPECIMEN QUALITY:: ADEQUATE

## 2021-12-19 ENCOUNTER — Encounter: Payer: Self-pay | Admitting: General Practice

## 2022-01-29 IMAGING — US US SOFT TISSUE HEAD/NECK
1 series · 13 of 21 positions shown · non-contrast
Comparison: None available.

CLINICAL DATA: Initial evaluation for submandibular salivary gland
swelling.

EXAM:
ULTRASOUND OF HEAD/NECK SOFT TISSUES
TECHNIQUE: Ultrasound examination of the head and neck soft tissues was
performed in the area of clinical concern.

[Series 1: us soft tissue head/neck · 21 acquisitions, 13 frames shown]
[im 1/21]
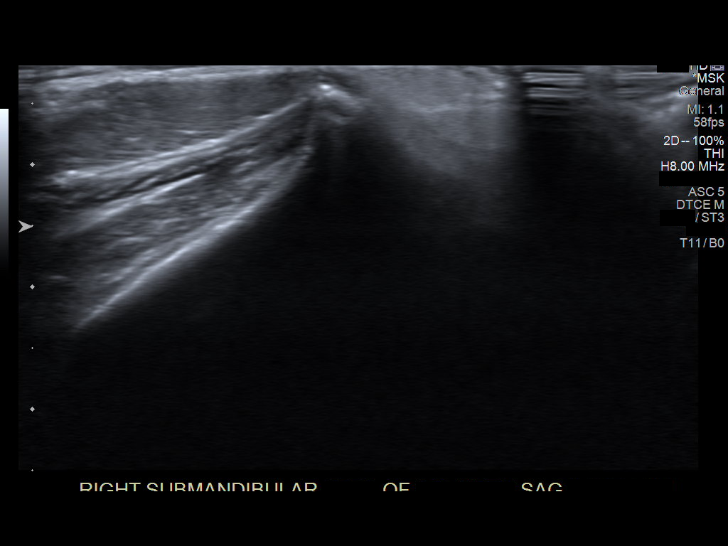
[im 3/21]
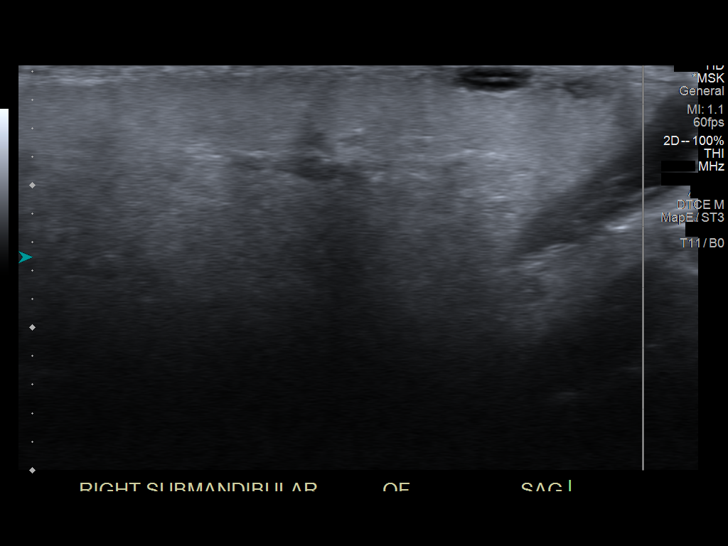
[im 5/21]
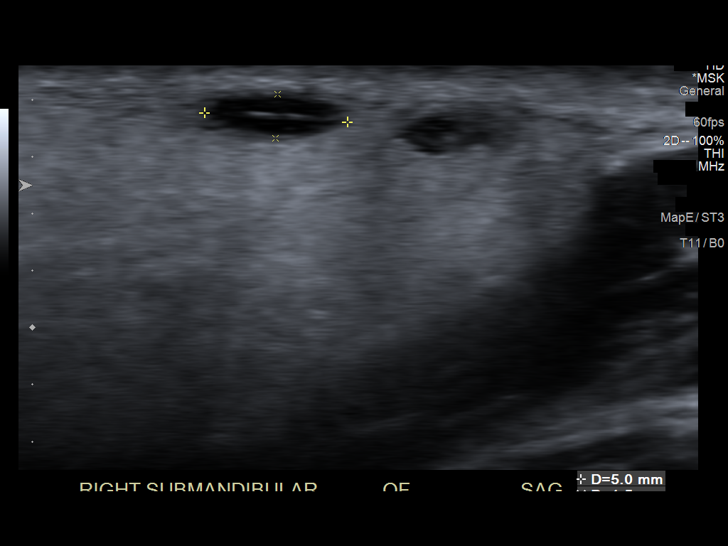
[im 6/21]
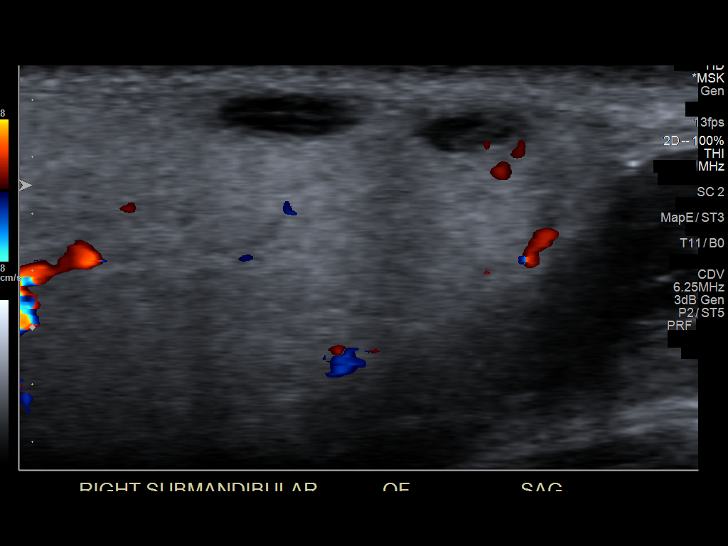
[im 8/21]
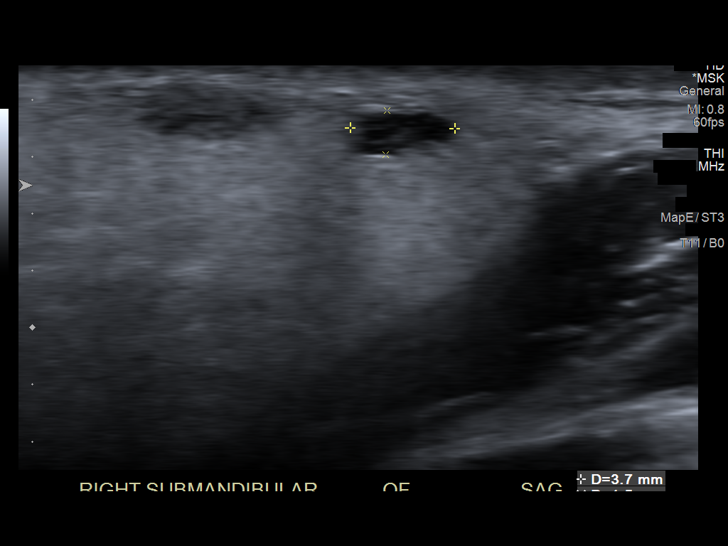
[im 9/21]
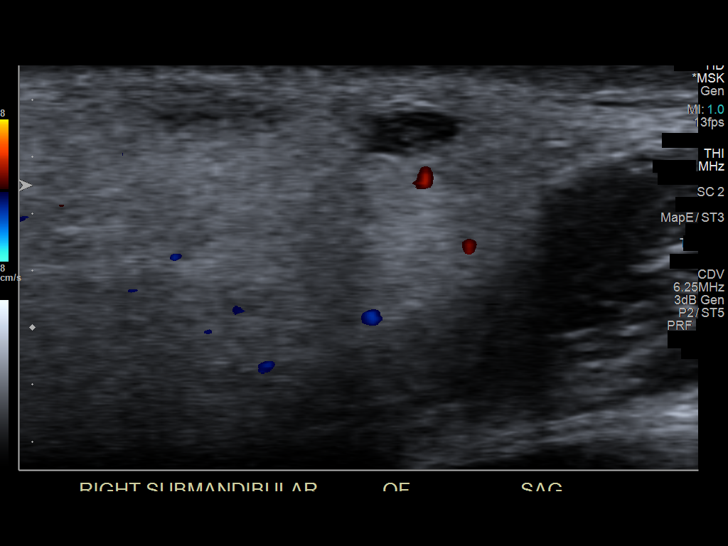
[im 11/21]
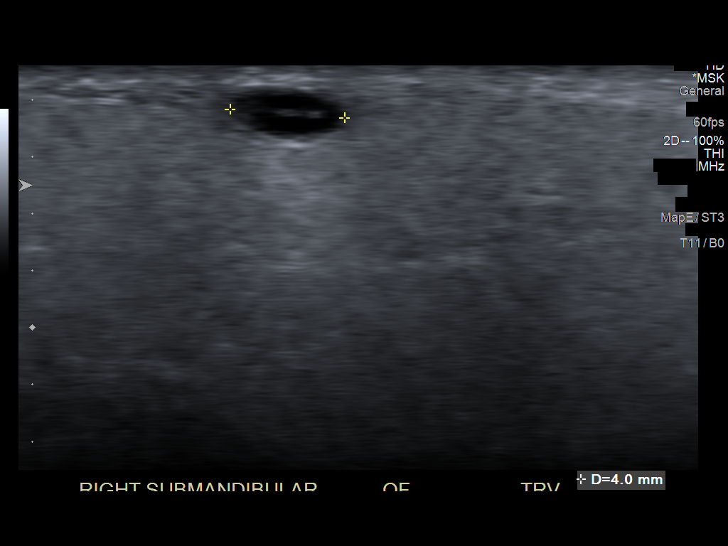
[im 13/21]
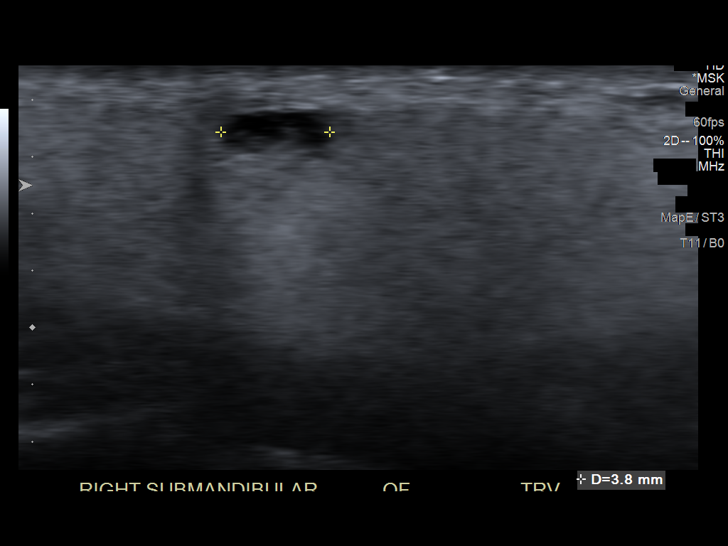
[im 14/21]
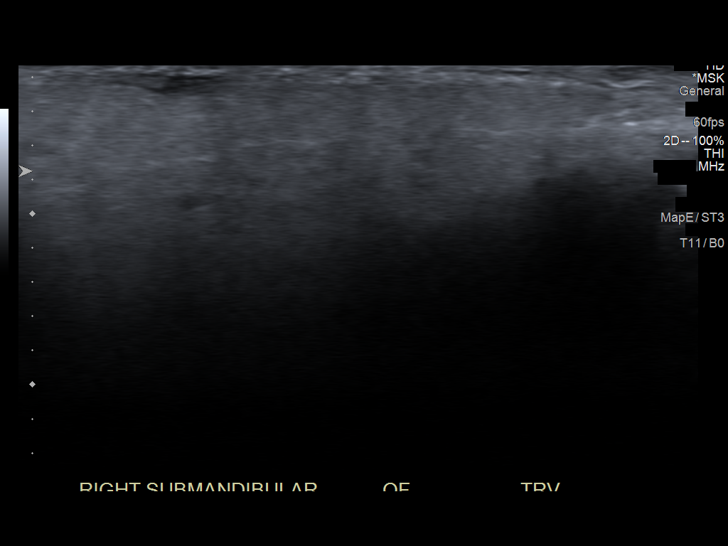
[im 16/21]
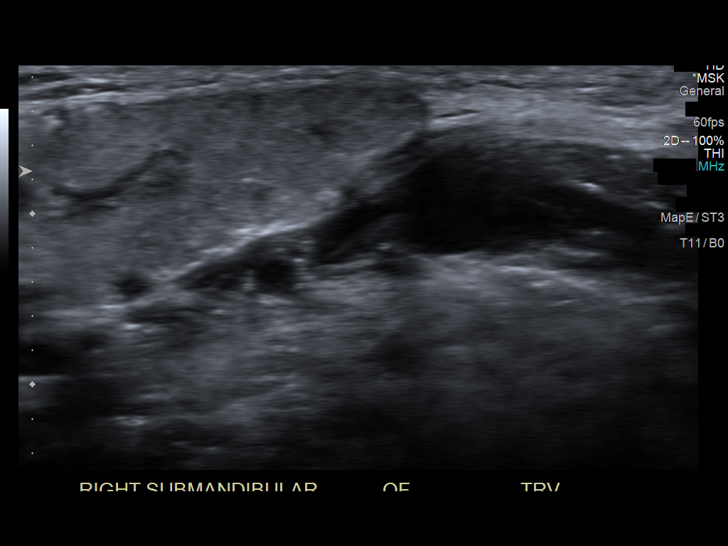
[im 17/21]
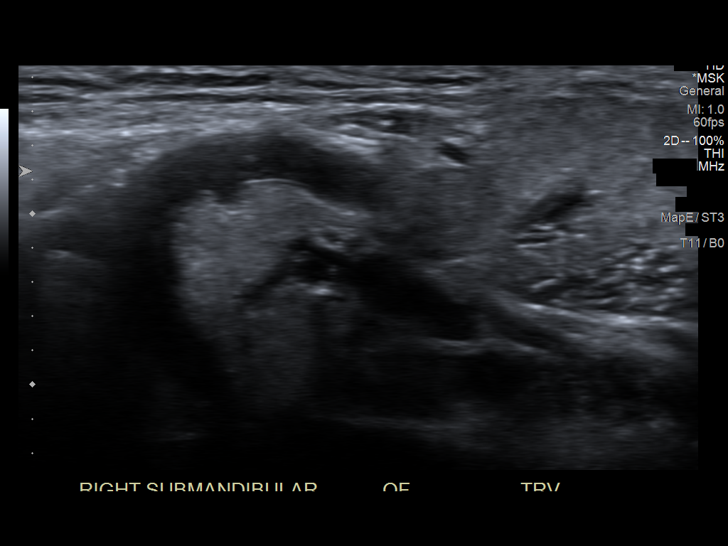
[im 19/21]
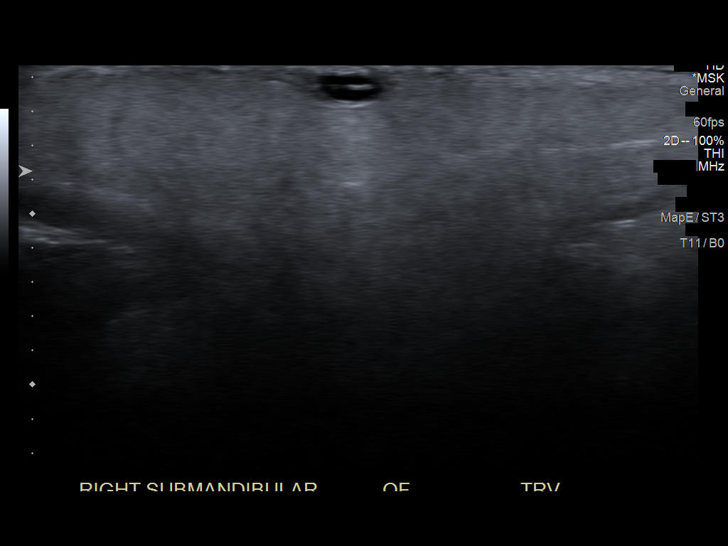
[im 21/21]
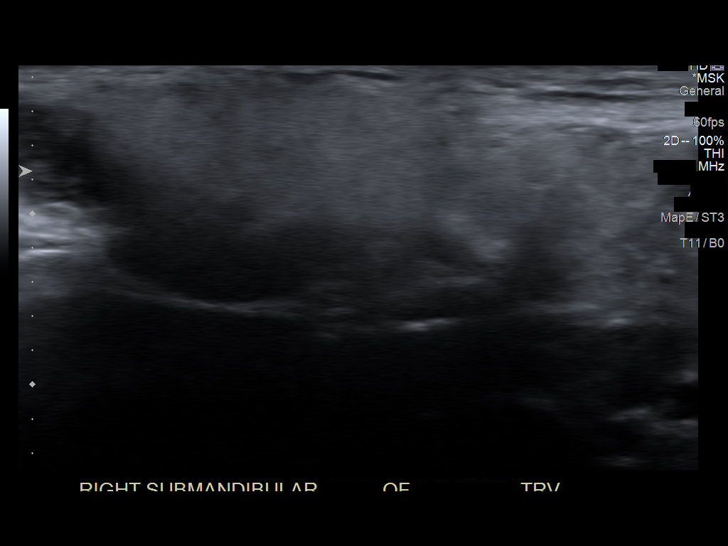

[13 of 21 positions shown; findings below may reference images not displayed]

FINDINGS: The visualized right submandibular gland demonstrates a somewhat
heterogeneous echotexture with associated hypervascularity,
suggesting acute sialoadenitis. No shadowing echogenic stones are
visible. No definite dilatation of the right submandibular duct on
the images provided. No discrete mass lesion or collection.

Additional views of the right parotid gland were performed as well.
The parotid glandular tissue seen is normal in appearance with
homogeneous echotexture. Few subcentimeter well-circumscribed
hypoechoic cystic lesions likely reflects small intraparotid lymph
nodes, normal in appearance. No other mass lesion, collection, or
other abnormality.
IMPRESSION: Heterogeneous echotexture and hypervascularity involving the
visualized right submandibular gland, suggesting acute
sialoadenitis. No definite swelling of the right submandibular duct
on the images provided. No discrete mass lesion or collection. If
symptoms should persist despite management, further evaluation with
dedicated contrast enhanced CT of the neck to evaluate for a
possible distally obstructive process within the right submandibular
duct would be suggested.

## 2022-05-23 LAB — HM DIABETES EYE EXAM

## 2022-05-26 ENCOUNTER — Other Ambulatory Visit: Payer: Self-pay | Admitting: Family Medicine

## 2022-05-27 NOTE — Telephone Encounter (Signed)
Pt is scheduled for 06-12-22

## 2022-05-27 NOTE — Telephone Encounter (Signed)
Please contact the patient to reschedule missed appt. Sending 30 day medication refill. Thanks

## 2022-06-06 ENCOUNTER — Encounter: Payer: Self-pay | Admitting: Pharmacist

## 2022-06-12 ENCOUNTER — Encounter: Payer: Self-pay | Admitting: Family Medicine

## 2022-06-12 ENCOUNTER — Ambulatory Visit (INDEPENDENT_AMBULATORY_CARE_PROVIDER_SITE_OTHER): Payer: Medicare HMO | Admitting: Family Medicine

## 2022-06-12 VITALS — BP 130/84 | HR 32 | Ht 60.0 in | Wt 152.0 lb

## 2022-06-12 DIAGNOSIS — E782 Mixed hyperlipidemia: Secondary | ICD-10-CM | POA: Diagnosis not present

## 2022-06-12 DIAGNOSIS — I10 Essential (primary) hypertension: Secondary | ICD-10-CM

## 2022-06-12 DIAGNOSIS — E119 Type 2 diabetes mellitus without complications: Secondary | ICD-10-CM | POA: Diagnosis not present

## 2022-06-12 DIAGNOSIS — R001 Bradycardia, unspecified: Secondary | ICD-10-CM

## 2022-06-12 MED ORDER — ROSUVASTATIN CALCIUM 10 MG PO TABS: 10.0000 mg | ORAL_TABLET | Freq: Every day | ORAL | 3 refills | Status: DC

## 2022-06-12 MED ORDER — METFORMIN HCL 500 MG PO TABS: 500.0000 mg | ORAL_TABLET | Freq: Two times a day (BID) | ORAL | 3 refills | Status: DC

## 2022-06-12 MED ORDER — AMLODIPINE BESYLATE 5 MG PO TABS: 5.0000 mg | ORAL_TABLET | Freq: Every day | ORAL | 3 refills | Status: DC

## 2022-06-12 MED ORDER — RAMIPRIL 10 MG PO CAPS: 10.0000 mg | ORAL_CAPSULE | Freq: Every day | ORAL | 3 refills | Status: DC

## 2022-06-12 NOTE — Assessment & Plan Note (Signed)
BP is well controlled continue current medications for management of HTN.

## 2022-06-12 NOTE — Progress Notes (Signed)
Barry Gonzalez - 72 y.o. male MRN [id number]  Date of birth: 1951-04-17  Subjective Chief Complaint  Patient presents with   Follow-up    Medication refill Sleeping only 2-4 hours per night     HPI Barry Gonzalez is 72 y.o. male here today for follow up visit.   Reports that he is doing pretty well.   Continues on amlodipine and ramipril for management of HTN.  He has done well with current medications.  History of 2nd degree heart black.  Denies symptoms related to this including fatigue, dyspnea or pre-syncope.  He does see cardiology through [facility].  Next appt is September.   Remains on metformin for management of diabetes.  Remains moderately active.   He does see urology for recurrent prostate problems.  Remains on tamsulosin and finasteride.   He is having some difficulty with sleep.  He has tried zquil which has helped.  He would like to know if he may continue this.   ROS:  A comprehensive ROS was completed and negative except as noted per HPI  Allergies  Allergen Reactions   Penicillins Nausea And Vomiting    Past Medical History:  Diagnosis Date   Diabetes mellitus without complication ([place])    Hypertension     History reviewed. No pertinent surgical history.  Social History   Socioeconomic History   Marital status: Married    Spouse name: Not on file   Number of children: Not on file   Years of education: Not on file   Highest education level: Not on file  Occupational History   Not on file  Tobacco Use   Smoking status: Former   Smokeless tobacco: Never  Vaping Use   Vaping Use: Never used  Substance and Sexual Activity   Alcohol use: Never   Drug use: Never   Sexual activity: Yes    Partners: Female  Other Topics Concern   Not on file  Social History Narrative   Not on file   Social Determinants of Health   Financial Resource Strain: Not on file  Food Insecurity: Not on file  Transportation Needs: Not on file  Physical Activity:  Not on file  Stress: Not on file  Social Connections: Not on file    Family History  Problem Relation Age of Onset   Hypertension Other    Stroke Other     Health Maintenance  Topic Date Due   Medicare Annual Wellness (AWV)  Never done   Diabetic kidney evaluation - eGFR measurement  06/08/2022   HEMOGLOBIN A1C  05/25/2022   COVID-19 Vaccine (5 - 2023-24 season) 06/28/2022 (Originally 12/28/2021)   Zoster Vaccines- Shingrix (1 of 2) 09/10/2022 (Originally 06/05/2000)   Pneumonia Vaccine 89+ Years old (2 of 2 - PPSV23 or PCV20) 11/23/2022 (Originally 06/06/2016)   COLONOSCOPY (Pts 45-13yr Insurance coverage will need to be confirmed)  11/23/2022 (Originally 06/06/1995)   Hepatitis C Screening  11/23/2022 (Originally 06/05/1968)   OPHTHALMOLOGY EXAM  07/18/2022   Diabetic kidney evaluation - Urine ACR  11/23/2022   FOOT EXAM  11/23/2022   INFLUENZA VACCINE  Completed   HPV VACCINES  Aged Out   DTaP/Tdap/Td  Discontinued     ----------------------------------------------------------------------------------------------------------------------------------------------------------------------------------------------------------------- Physical Exam BP 130/84 (BP Location: Left Arm, Patient Position: Sitting, Cuff Size: Normal)   Pulse (!) 32   Ht 5' (1.524 m)   Wt 152 lb 0.6 oz (69 kg)   SpO2 100%   BMI 29.69 kg/m   Physical Exam Constitutional:  Appearance: Normal appearance.  HENT:     Head: Normocephalic and atraumatic.  Eyes:     General: No scleral icterus. Cardiovascular:     Rate and Rhythm: Regular rhythm. Bradycardia present.  Pulmonary:     Effort: Pulmonary effort is normal.     Breath sounds: Normal breath sounds.  Musculoskeletal:     Cervical back: Neck supple.  Neurological:     Mental Status: He is alert.  Psychiatric:        Mood and Affect: Mood normal.        Behavior: Behavior normal.      ------------------------------------------------------------------------------------------------------------------------------------------------------------------------------------------------------------------- Assessment and Plan  Essential hypertension BP is well controlled continue current medications for management of HTN.  T2DM (type 2 diabetes mellitus) ([place]) He has done well with metformin.  Updating A1c.   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  Completely asymptomatic.  Discussed symptoms and red flags.  Also followed by cardiology.    No orders of the defined types were placed in this encounter.   Return in about 6 months (around 12/11/2022) for HTN/T2Dm.    This visit occurred during the SARS-CoV-2 public health emergency.  Safety protocols were in place, including screening questions prior to the visit, additional usage of staff PPE, and extensive cleaning of exam room while observing appropriate contact time as indicated for disinfecting solutions.

## 2022-06-12 NOTE — Assessment & Plan Note (Signed)
He has done well with metformin.  Updating A1c.

## 2022-06-12 NOTE — Assessment & Plan Note (Signed)
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  Completely asymptomatic.  Discussed symptoms and red flags.  Also followed by cardiology.

## 2022-06-12 NOTE — Addendum Note (Signed)
Addended by: Perlie Mayo on: 06/12/2022 01:56 PM   Modules accepted: Orders

## 2022-06-15 LAB — HEMOGLOBIN A1C
Hgb A1c MFr Bld: 6.6 % of total Hgb — ABNORMAL HIGH (ref <5.7)
Mean Plasma Glucose: 143 mg/dL
eAG (mmol/L): 7.9 mmol/L

## 2022-06-15 LAB — MICROALBUMIN / CREATININE URINE RATIO
Creatinine, Urine: 64 mg/dL (ref 20–320)
Microalb Creat Ratio: 25 mcg/mg creat (ref <30)
Microalb, Ur: 1.6 mg/dL

## 2022-06-15 LAB — CBC WITH DIFFERENTIAL/PLATELET
Absolute Monocytes: 508 cells/uL (ref 200–950)
Basophils Absolute: 32 cells/uL (ref 0–200)
Basophils Relative: 0.9 %
Eosinophils Absolute: 70 cells/uL (ref 15–500)
Eosinophils Relative: 2 %
HCT: 40.7 % (ref 38.5–50.0)
Hemoglobin: 13.7 g/dL (ref 13.2–17.1)
Lymphs Abs: 1551 cells/uL (ref 850–3900)
MCH: 30 pg (ref 27.0–33.0)
MCHC: 33.7 g/dL (ref 32.0–36.0)
MCV: 89.3 fL (ref 80.0–100.0)
MPV: 11.2 fL (ref 7.5–12.5)
Monocytes Relative: 14.5 %
Neutro Abs: 1341 cells/uL — ABNORMAL LOW (ref 1500–7800)
Neutrophils Relative %: 38.3 %
Platelets: 157 10*3/uL (ref 140–400)
RBC: 4.56 10*6/uL (ref 4.20–5.80)
RDW: 13.5 % (ref 11.0–15.0)
Total Lymphocyte: 44.3 %
WBC: 3.5 10*3/uL — ABNORMAL LOW (ref 3.8–10.8)

## 2022-06-15 LAB — COMPLETE METABOLIC PANEL WITH GFR
AG Ratio: 1.6 (calc) (ref 1.0–2.5)
ALT: 9 U/L (ref 9–46)
AST: 12 U/L (ref 10–35)
Albumin: 4.2 g/dL (ref 3.6–5.1)
Alkaline phosphatase (APISO): 38 U/L (ref 35–144)
BUN: 19 mg/dL (ref 7–25)
CO2: 30 mmol/L (ref 20–32)
Calcium: 10.4 mg/dL — ABNORMAL HIGH (ref 8.6–10.3)
Chloride: 106 mmol/L (ref 98–110)
Creat: 1.22 mg/dL (ref 0.70–1.28)
Globulin: 2.6 g/dL (calc) (ref 1.9–3.7)
Glucose, Bld: 92 mg/dL (ref 65–99)
Potassium: 4.6 mmol/L (ref 3.5–5.3)
Sodium: 142 mmol/L (ref 135–146)
Total Bilirubin: 0.7 mg/dL (ref 0.2–1.2)
Total Protein: 6.8 g/dL (ref 6.1–8.1)
eGFR: 63 mL/min/{1.73_m2} (ref >=60)

## 2022-06-15 LAB — LIPID PANEL W/REFLEX DIRECT LDL
Cholesterol: 171 mg/dL (ref <200)
HDL: 62 mg/dL (ref >=40)
LDL Cholesterol (Calc): 95 mg/dL (calc)
Non-HDL Cholesterol (Calc): 109 mg/dL (calc) (ref <130)
Total CHOL/HDL Ratio: 2.8 (calc) (ref <5.0)
Triglycerides: 58 mg/dL (ref <150)

## 2022-09-11 ENCOUNTER — Ambulatory Visit (INDEPENDENT_AMBULATORY_CARE_PROVIDER_SITE_OTHER): Payer: Medicare HMO | Admitting: Family Medicine

## 2022-09-11 DIAGNOSIS — Z0000 Encounter for general adult medical examination without abnormal findings: Secondary | ICD-10-CM

## 2022-09-11 NOTE — Patient Instructions (Signed)
MEDICARE ANNUAL WELLNESS VISIT Health Maintenance Summary and Written Plan of Care  Barry Gonzalez ,  Thank you for allowing me to perform your Medicare Annual Wellness Visit and for your ongoing commitment to your health.   Health Maintenance & Immunization History Health Maintenance  Topic Date Due   OPHTHALMOLOGY EXAM  09/11/2022 (Originally 07/18/2022)   COVID-19 Vaccine (5 - 2023-24 season) 09/27/2022 (Originally 12/28/2021)   Pneumonia Vaccine 75+ Years old (2 of 2 - PPSV23 or PCV20) 11/23/2022 (Originally 08/02/2015)   COLONOSCOPY (Pts 45-11yrs Insurance coverage will need to be confirmed)  11/23/2022 (Originally 06/06/1995)   Hepatitis C Screening  11/23/2022 (Originally 06/05/1968)   Zoster Vaccines- Shingrix (1 of 2) 12/12/2022 (Originally 06/05/2000)   FOOT EXAM  11/23/2022   INFLUENZA VACCINE  11/28/2022   HEMOGLOBIN A1C  12/13/2022   Diabetic kidney evaluation - eGFR measurement  06/15/2023   Diabetic kidney evaluation - Urine ACR  06/15/2023   Medicare Annual Wellness (AWV)  09/11/2023   HPV VACCINES  Aged Out   DTaP/Tdap/Td  Discontinued   Immunization History  Administered Date(s) Administered   Fluad Quad(high Dose 65+) 12/28/2020, 01/22/2022   Influenza-Unspecified 02/06/2020   PFIZER Comirnaty(Gray Top)Covid-19 Tri-Sucrose Vaccine May 15, 1950   PFIZER(Purple Top)SARS-COV-2 Vaccination 05/07/2019, 05/28/2019, 01/22/2020   Pneumococcal Conjugate-13 06/07/2015   Tdap 10/28/2010    These are the patient goals that we discussed:  Goals Addressed               This Visit's Progress     Patient Stated (pt-stated)        Patient stated that he would like to continue to maintain his current healthy life style.         This is a list of Health Maintenance Items that are overdue or due now: Shingles vaccine - declined Diabetic eye exam - had one earlier this year; he will records faxed over.  Pneumonia vaccine - declined Colorectal cancer screening - declined     Orders/Referrals Placed Today: No orders of the defined types were placed in this encounter.  (Contact our referral department at [phone] if you have not spoken with someone about your referral appointment within the next 5 days)    Follow-up Plan Follow-up with Everrett Coombe, DO as planned Please have eye exam records faxed to our office. Medicare wellness visit in one year.  Patient will access ASV on my chart.     Health Maintenance, Male Adopting a healthy lifestyle and getting preventive care are important in promoting health and wellness. Ask your health care provider about: The right schedule for you to have regular tests and exams. Things you can do on your own to prevent diseases and keep yourself healthy. What should I know about diet, weight, and exercise? Eat a healthy diet  Eat a diet that includes plenty of vegetables, fruits, low-fat dairy products, and lean protein. Do not eat a lot of foods that are high in solid fats, added sugars, or sodium. Maintain a healthy weight Body mass index (BMI) is a measurement that can be used to identify possible weight problems. It estimates body fat based on height and weight. Your health care provider can help determine your BMI and help you achieve or maintain a healthy weight. Get regular exercise Get regular exercise. This is one of the most important things you can do for your health. Most adults should: Exercise for at least 150 minutes each week. The exercise should increase your heart rate and make you sweat (moderate-intensity  exercise). Do strengthening exercises at least twice a week. This is in addition to the moderate-intensity exercise. Spend less time sitting. Even light physical activity can be beneficial. Watch cholesterol and blood lipids Have your blood tested for lipids and cholesterol at 72 years of age, then have this test every 5 years. You may need to have your cholesterol levels checked more often  if: Your lipid or cholesterol levels are high. You are older than 72 years of age. You are at high risk for heart disease. What should I know about cancer screening? Many types of cancers can be detected early and may often be prevented. Depending on your health history and family history, you may need to have cancer screening at various ages. This may include screening for: Colorectal cancer. Prostate cancer. Skin cancer. Lung cancer. What should I know about heart disease, diabetes, and high blood pressure? Blood pressure and heart disease High blood pressure causes heart disease and increases the risk of stroke. This is more likely to develop in people who have high blood pressure readings or are overweight. Talk with your health care provider about your target blood pressure readings. Have your blood pressure checked: Every 3-5 years if you are 65-43 years of age. Every year if you are 54 years old or older. If you are between the ages of 99 and 19 and are a current or former smoker, ask your health care provider if you should have a one-time screening for abdominal aortic aneurysm (AAA). Diabetes Have regular diabetes screenings. This checks your fasting blood sugar level. Have the screening done: Once every three years after age 61 if you are at a normal weight and have a low risk for diabetes. More often and at a younger age if you are overweight or have a high risk for diabetes. What should I know about preventing infection? Hepatitis B If you have a higher risk for hepatitis B, you should be screened for this virus. Talk with your health care provider to find out if you are at risk for hepatitis B infection. Hepatitis C Blood testing is recommended for: Everyone born from [birth year] through 1965. Anyone with known risk factors for hepatitis C. Sexually transmitted infections (STIs) You should be screened each year for STIs, including gonorrhea and chlamydia, if: You are sexually  active and are younger than 72 years of age. You are older than 72 years of age and your health care provider tells you that you are at risk for this type of infection. Your sexual activity has changed since you were last screened, and you are at increased risk for chlamydia or gonorrhea. Ask your health care provider if you are at risk. Ask your health care provider about whether you are at high risk for HIV. Your health care provider may recommend a prescription medicine to help prevent HIV infection. If you choose to take medicine to prevent HIV, you should first get tested for HIV. You should then be tested every 3 months for as long as you are taking the medicine. Follow these instructions at home: Alcohol use Do not drink alcohol if your health care provider tells you not to drink. If you drink alcohol: Limit how much you have to 0-2 drinks a day. Know how much alcohol is in your drink. In the [place] one drink equals one 12 oz bottle of beer (355 mL), one 5 oz glass of wine (148 mL), or one 1 oz glass of hard liquor (44 mL). Lifestyle Do not  use any products that contain nicotine or tobacco. These products include cigarettes, chewing tobacco, and vaping devices, such as e-cigarettes. If you need help quitting, ask your health care provider. Do not use street drugs. Do not share needles. Ask your health care provider for help if you need support or information about quitting drugs. General instructions Schedule regular health, dental, and eye exams. Stay current with your vaccines. Tell your health care provider if: You often feel depressed. You have ever been abused or do not feel safe at home. Summary Adopting a healthy lifestyle and getting preventive care are important in promoting health and wellness. Follow your health care provider's instructions about healthy diet, exercising, and getting tested or screened for diseases. Follow your health care provider's instructions on  monitoring your cholesterol and blood pressure. This information is not intended to replace advice given to you by your health care provider. Make sure you discuss any questions you have with your health care provider. Document Revised: 09/04/2020 Document Reviewed: 09/04/2020 Elsevier Patient Education  2023 [employer].

## 2022-09-11 NOTE — Progress Notes (Signed)
MEDICARE ANNUAL WELLNESS VISIT  09/11/2022  Telephone Visit Disclaimer This Medicare AWV was conducted by telephone due to national recommendations for restrictions regarding the COVID-19 Pandemic (e.g. social distancing).  I verified, using two identifiers, that I am speaking with Barry Gonzalez or their authorized healthcare agent. I discussed the limitations, risks, security, and privacy concerns of performing an evaluation and management service by telephone and the potential availability of an in-person appointment in the future. The patient expressed understanding and agreed to proceed.  Location of Patient: Home Location of Provider (nurse):  in the office.  Subjective:    Barry Gonzalez is a 72 y.o. male patient of Everrett Coombe, DO who had a Medicare Annual Wellness Visit today via telephone. Barry Gonzalez is Retired and lives with their spouse. he has 5 children. he reports that he is socially active and does interact with friends/family regularly. he is moderately physically active and enjoys reading and watching movies.  Patient Care Team: Everrett Coombe, DO as PCP - General (Family Medicine)     09/11/2022    8:09 AM 06/29/2020    1:33 PM 04/18/2018    2:20 PM  Advanced Directives  Does Patient Have a Medical Advance Directive? No No No  Would patient like information on creating a medical advance directive? No - Patient declined No - Patient declined     Hospital Utilization Over the Past 12 Months: # of hospitalizations or ER visits: 0 # of surgeries: 0  Review of Systems    Patient reports that his overall health is better compared to last year.  History obtained from chart review and the patient  Patient Reported Readings (BP, Pulse, CBG, Weight, etc) none  Pain Assessment Pain : No/denies pain     Current Medications & Allergies (verified) Allergies as of 09/11/2022       Reactions   Penicillins Nausea And Vomiting        Medication List         Accurate as of Sep 11, 2022  8:15 AM. If you have any questions, ask your nurse or doctor.          STOP taking these medications    ciprofloxacin 500 MG tablet Commonly known as: CIPRO       TAKE these medications    amLODipine 5 MG tablet Commonly known as: NORVASC Take 1 tablet (5 mg total) by mouth daily.   dorzolamide-timolol 2-0.5 % ophthalmic solution Commonly known as: COSOPT 1 drop 2 (two) times daily.   finasteride 5 MG tablet Commonly known as: PROSCAR Take 5 mg by mouth daily.   ketoconazole 2 % cream Commonly known as: NIZORAL Apply to affected areas daily as needed.   metFORMIN 500 MG tablet Commonly known as: GLUCOPHAGE Take 1 tablet (500 mg total) by mouth 2 (two) times daily with a meal.   Multi-Vitamin tablet Take 1 tablet by mouth daily.   Omega-3 1000 MG Caps Take by mouth.   ramipril 10 MG capsule Commonly known as: ALTACE Take 1 capsule (10 mg total) by mouth daily.   rosuvastatin 10 MG tablet Commonly known as: CRESTOR Take 1 tablet (10 mg total) by mouth daily.   tamsulosin 0.4 MG Caps capsule Commonly known as: FLOMAX Take 0.4 mg by mouth daily.   triamcinolone cream 0.1 % Commonly known as: KENALOG Apply 1 application topically 2 (two) times daily.        History (reviewed): Past Medical History:  Diagnosis Date   Allergy 07/31/2022  Due to pollen i have been sneezing, and having mucus running down my throat. I am taking over the counter medication for it.   Cataract    My eye doctor mentioned it about 8 years back   Diabetes mellitus without complication (HCC)    Glaucoma    I have been receiving treatment for high pressure in my eyes for some now.   Hypertension    History reviewed. No pertinent surgical history. Family History  Problem Relation Age of Onset   Hypertension Other    Stroke Other    Hypertension Mother    Social History   Socioeconomic History   Marital status: Married    Spouse  name: Vicken Nethercott   Number of children: 5   Years of education: 18   Highest education level: Master's degree (e.g., MA, MS, MEng, MEd, MSW, MBA)  Occupational History   Occupation: Retired  Tobacco Use   Smoking status: Former    Packs/day: 0.25    Years: 20.00    Additional pack years: 0.00    Total pack years: 5.00    Types: Cigarettes    Quit date: 05/18/2001    Years since quitting: 21.3   Smokeless tobacco: Never   Tobacco comments:    I stopped more than 20 years ago.  Vaping Use   Vaping Use: Never used  Substance and Sexual Activity   Alcohol use: Not Currently   Drug use: Never   Sexual activity: Yes    Partners: Female    Birth control/protection: None  Other Topics Concern   Not on file  Social History Narrative   Lives with his wife. He had five children; one passed away last year 09-25-21). He enjoys watching movies and reading.   Social Determinants of Health   Financial Resource Strain: Low Risk  (09/07/2022)   Overall Financial Resource Strain (CARDIA)    Difficulty of Paying Living Expenses: Not hard at all  Food Insecurity: No Food Insecurity (09/07/2022)   Hunger Vital Sign    Worried About Running Out of Food in the Last Year: Never true    Ran Out of Food in the Last Year: Never true  Transportation Needs: No Transportation Needs (09/07/2022)   PRAPARE - Administrator, Civil Service (Medical): No    Lack of Transportation (Non-Medical): No  Physical Activity: Insufficiently Active (09/07/2022)   Exercise Vital Sign    Days of Exercise per Week: 4 days    Minutes of Exercise per Session: 30 min  Stress: No Stress Concern Present (09/07/2022)   [employer] of Occupational Health - Occupational Stress Questionnaire    Feeling of Stress : Only a little  Social Connections: Moderately Isolated (09/11/2022)   Social Connection and Isolation Panel [NHANES]    Frequency of Communication with Friends and Family: Twice a week    Frequency  of Social Gatherings with Friends and Family: Never    Attends Religious Services: More than 4 times per year    Active Member of [employer] or Organizations: No    Attends Banker Meetings: Never    Marital Status: Married    Activities of Daily Living    09/07/2022    8:52 AM  In your present state of health, do you have any difficulty performing the following activities:  Hearing? 0  Vision? 0  Difficulty concentrating or making decisions? 0  Walking or climbing stairs? 0  Dressing or bathing? 0  Doing errands, shopping? 0  Preparing Food and eating ? N  Using the Toilet? N  In the past six months, have you accidently leaked urine? N  Do you have problems with loss of bowel control? N  Managing your Medications? N  Managing your Finances? N  Housekeeping or managing your Housekeeping? N    Patient Education/ Literacy How often do you need to have someone help you when you read instructions, pamphlets, or other written materials from your doctor or pharmacy?: 1 - Never What is the last grade level you completed in school?: Masters degree  Exercise Current Exercise Habits: Home exercise routine, Type of exercise: treadmill;strength training/weights;stretching;Other - see comments (cardio and exercise bike), Time (Minutes): 30, Frequency (Times/Week): 4, Weekly Exercise (Minutes/Week): 120, Intensity: Moderate, Exercise limited by: None identified  Diet Patient reports consuming 3 meals a day and 0-1 snack(s) a day Patient reports that his primary diet is: Regular Patient reports that she does have regular access to food.   Depression Screen    09/11/2022    8:09 AM 09/11/2022    8:08 AM 11/22/2021    2:48 PM 05/25/2021   12:13 PM 06/29/2020    1:33 PM  PHQ 2/9 Scores  PHQ - 2 Score 0 0 0 0 1  PHQ- 9 Score    0 4     Fall Risk    09/11/2022    8:08 AM 09/07/2022    8:52 AM 06/12/2022    1:42 PM 11/22/2021    2:48 PM 05/25/2021   12:13 PM  Fall Risk   Falls in  the past year? 0 0 0 0 0  Number falls in past yr: 0 0 0 0 0  Injury with Fall? 0 0 0 0 0  Risk for fall due to : No Fall Risks  No Fall Risks No Fall Risks No Fall Risks  Follow up Falls evaluation completed  Falls evaluation completed Falls evaluation completed Falls evaluation completed     Objective:  Barry Gonzalez seemed alert and oriented and he participated appropriately during our telephone visit.  Blood Pressure Weight BMI  BP Readings from Last 3 Encounters:  06/12/22 130/84  11/22/21 140/62  05/25/21 140/80   Wt Readings from Last 3 Encounters:  06/12/22 152 lb 0.6 oz (69 kg)  11/22/21 154 lb (69.9 kg)  05/25/21 154 lb (69.9 kg)   BMI Readings from Last 1 Encounters:  06/12/22 29.69 kg/m    *Unable to obtain current vital signs, weight, and BMI due to telephone visit type  Hearing/Vision  Barry Gonzalez did not seem to have difficulty with hearing/understanding during the telephone conversation Reports that he has had a formal eye exam by an eye care professional within the past year Reports that he has not had a formal hearing evaluation within the past year *Unable to fully assess hearing and vision during telephone visit type  Cognitive Function:    09/11/2022    8:10 AM  6CIT Screen  What Year? 0 points  What month? 0 points  What time? 0 points  Count back from 20 0 points  Months in reverse 2 points  Repeat phrase 0 points  Total Score 2 points   (Normal:0-7, Significant for Dysfunction: >8)  Normal Cognitive Function Screening: Yes   Immunization & Health Maintenance Record Immunization History  Administered Date(s) Administered   Fluad Quad(high Dose 65+) 12/28/2020, 01/22/2022   Influenza-Unspecified 02/06/2020   PFIZER Comirnaty(Gray Top)Covid-19 Tri-Sucrose Vaccine 1951/04/13   PFIZER(Purple Top)SARS-COV-2 Vaccination 05/07/2019, 05/28/2019, 01/22/2020  Pneumococcal Conjugate-13 06/07/2015   Tdap 10/28/2010    Health Maintenance  Topic  Date Due   OPHTHALMOLOGY EXAM  09/11/2022 (Originally 07/18/2022)   COVID-19 Vaccine (5 - 2023-24 season) 09/27/2022 (Originally 12/28/2021)   Pneumonia Vaccine 28+ Years old (2 of 2 - PPSV23 or PCV20) 11/23/2022 (Originally 08/02/2015)   COLONOSCOPY (Pts 45-52yrs Insurance coverage will need to be confirmed)  11/23/2022 (Originally 06/06/1995)   Hepatitis C Screening  11/23/2022 (Originally [birth year]05/1968)   Zoster Vaccines- Shingrix (1 of 2) 12/12/2022 (Originally 06/05/2000)   FOOT EXAM  11/23/2022   INFLUENZA VACCINE  11/28/2022   HEMOGLOBIN A1C  12/13/2022   Diabetic kidney evaluation - eGFR measurement  06/15/2023   Diabetic kidney evaluation - Urine ACR  06/15/2023   Medicare Annual Wellness (AWV)  09/11/2023   HPV VACCINES  Aged Out   DTaP/Tdap/Td  Discontinued       Assessment  This is a routine wellness examination for [employer].  Health Maintenance: Due or Overdue There are no preventive care reminders to display for this patient.   Barry Gonzalez does not need a referral for Community Assistance: Care Management:   no Social Work:    no Prescription Assistance:  no Nutrition/Diabetes Education:  no   Plan:  Personalized Goals  Goals Addressed               This Visit's Progress     Patient Stated (pt-stated)        Patient stated that he would like to continue to maintain his current healthy life style.       Personalized Health Maintenance & Screening Recommendations  Shingles vaccine  - declined Diabetic eye exam - had one earlier this year; he will records faxed over.  Pneumonia vaccine - declined Colorectal cancer screening - declined  Lung Cancer Screening Recommended: no (Low Dose CT Chest recommended if Age 39-80 years, 20 pack-year currently smoking OR have quit w/in past 15 years) Hepatitis C Screening recommended: yes HIV Screening recommended: no  Advanced Directives: Written information was not prepared per patient's request.  Referrals  & Orders No orders of the defined types were placed in this encounter.   Follow-up Plan Follow-up with Everrett Coombe, DO as planned Please have eye exam records faxed to our office. Medicare wellness visit in one year.  Patient will access ASV on my chart.   I have personally reviewed and noted the following in the patient's chart:   Medical and social history Use of alcohol, tobacco or illicit drugs  Current medications and supplements Functional ability and status Nutritional status Physical activity Advanced directives List of other physicians Hospitalizations, surgeries, and ER visits in previous 12 months Vitals Screenings to include cognitive, depression, and falls Referrals and appointments  In addition, I have reviewed and discussed with Barry Gonzalez certain preventive protocols, quality metrics, and best practice recommendations. A written personalized care plan for preventive services as well as general preventive health recommendations is available and can be mailed to the patient at his request.      Modesto Charon, RN BSN  09/11/2022

## 2022-12-11 ENCOUNTER — Ambulatory Visit: Payer: Medicare HMO | Admitting: Family Medicine

## 2022-12-11 ENCOUNTER — Encounter: Payer: Self-pay | Admitting: Family Medicine

## 2022-12-11 VITALS — BP 130/70 | HR 36 | Ht 60.0 in | Wt 152.0 lb

## 2022-12-11 DIAGNOSIS — E782 Mixed hyperlipidemia: Secondary | ICD-10-CM | POA: Diagnosis not present

## 2022-12-11 DIAGNOSIS — I10 Essential (primary) hypertension: Secondary | ICD-10-CM | POA: Diagnosis not present

## 2022-12-11 DIAGNOSIS — I441 Atrioventricular block, second degree: Secondary | ICD-10-CM | POA: Diagnosis not present

## 2022-12-11 DIAGNOSIS — Z7984 Long term (current) use of oral hypoglycemic drugs: Secondary | ICD-10-CM

## 2022-12-11 DIAGNOSIS — E119 Type 2 diabetes mellitus without complications: Secondary | ICD-10-CM | POA: Diagnosis not present

## 2022-12-11 LAB — POCT GLYCOSYLATED HEMOGLOBIN (HGB A1C): HbA1c, POC (controlled diabetic range): 6.8 % (ref 0.0–7.0)

## 2022-12-11 NOTE — Assessment & Plan Note (Signed)
He has asymptomatic bradycardia.  Continues to see cardiology.

## 2022-12-11 NOTE — Assessment & Plan Note (Signed)
BP remains well controlled.  Continue current medications for management of HTN.   

## 2022-12-11 NOTE — Assessment & Plan Note (Signed)
Slight increase in A1c but overall remains well controlled.  Will continue metformin at current strength.  Encouraged dietary changes.

## 2022-12-11 NOTE — Progress Notes (Signed)
Barry Gonzalez - 72 y.o. male MRN [id number]  Date of birth: 1951/01/27  Subjective Chief Complaint  Patient presents with   Diabetes   Hypertension    HPI Barry Gonzalez is a 72 y.o. male here today for follow up visit.   He reports that he is doing well at this time.  Remains ramipirl and amlodipine for management of HTN.  BP is well controlled at this time. Tolerating medications well at current strength.  HR has been low with 2nd degree AV block.  He is asymptomatic.  Cardiology follows him for this as well.   Remains on metformin for management of diabetes.  He is doing well with this at current strength.  A1c today is up slightly at 6.8%.  Reports that diet has not been as good recently. Tolerating crestor well at current strength for associated HLD.   ROS:  A comprehensive ROS was completed and negative except as noted per HPI  Allergies  Allergen Reactions   Penicillins Nausea And Vomiting    Past Medical History:  Diagnosis Date   Allergy 07/31/2022   Due to pollen i have been sneezing, and having mucus running down my throat. I am taking over the counter medication for it.   Cataract    My eye doctor mentioned it about 8 years back   Diabetes mellitus without complication (HCC)    Glaucoma    I have been receiving treatment for high pressure in my eyes for some now.   Hypertension     History reviewed. No pertinent surgical history.  Social History   Socioeconomic History   Marital status: Married    Spouse name: Jalontae Bolan   Number of children: 5   Years of education: 18   Highest education level: Master's degree (e.g., MA, MS, MEng, MEd, MSW, MBA)  Occupational History   Occupation: Retired  Tobacco Use   Smoking status: Former    Current packs/day: 0.00    Average packs/day: 0.3 packs/day for 20.0 years (5.0 ttl pk-yrs)    Types: Cigarettes    Start date: 05/18/1981    Quit date: 05/18/2001    Years since quitting: 21.5   Smokeless  tobacco: Never   Tobacco comments:    I stopped more than 20 years ago.  Vaping Use   Vaping status: Never Used  Substance and Sexual Activity   Alcohol use: Not Currently   Drug use: Never   Sexual activity: Yes    Partners: Female    Birth control/protection: None  Other Topics Concern   Not on file  Social History Narrative   Lives with his wife. He had five children; one passed away last year 12-31-2021). He enjoys watching movies and reading.   Social Determinants of Health   Financial Resource Strain: Low Risk  (09/07/2022)   Overall Financial Resource Strain (CARDIA)    Difficulty of Paying Living Expenses: Not hard at all  Food Insecurity: No Food Insecurity (09/07/2022)   Hunger Vital Sign    Worried About Running Out of Food in the Last Year: Never true    Ran Out of Food in the Last Year: Never true  Transportation Needs: No Transportation Needs (09/07/2022)   PRAPARE - Administrator, Civil Service (Medical): No    Lack of Transportation (Non-Medical): No  Physical Activity: Insufficiently Active (09/07/2022)   Exercise Vital Sign    Days of Exercise per Week: 4 days    Minutes of Exercise per Session: 30 min  Stress: No Stress Concern Present (09/07/2022)   [employer] of Occupational Health - Occupational Stress Questionnaire    Feeling of Stress : Only a little  Social Connections: Moderately Isolated (09/11/2022)   Social Connection and Isolation Panel [NHANES]    Frequency of Communication with Friends and Family: Twice a week    Frequency of Social Gatherings with Friends and Family: Never    Attends Religious Services: More than 4 times per year    Active Member of [employer] or Organizations: No    Attends Banker Meetings: Never    Marital Status: Married    Family History  Problem Relation Age of Onset   Hypertension Other    Stroke Other    Hypertension Mother     Health Maintenance  Topic Date Due   Hepatitis C Screening   Never done   Colonoscopy  Never done   Pneumonia Vaccine 67+ Years old (2 of 2 - PPSV23 or PCV20) 06/06/2016   COVID-19 Vaccine (5 - 2023-24 season) 12/28/2021   OPHTHALMOLOGY EXAM  07/18/2022   INFLUENZA VACCINE  11/28/2022   Zoster Vaccines- Shingrix (1 of 2) 12/12/2022 (Originally 06/05/2000)   HEMOGLOBIN A1C  12/13/2022   Diabetic kidney evaluation - eGFR measurement  06/15/2023   Diabetic kidney evaluation - Urine ACR  06/15/2023   Medicare Annual Wellness (AWV)  09/11/2023   FOOT EXAM  12/11/2023   HPV VACCINES  Aged Out   DTaP/Tdap/Td  Discontinued     ----------------------------------------------------------------------------------------------------------------------------------------------------------------------------------------------------------------- Physical Exam BP 130/70 (BP Location: Left Arm, Patient Position: Sitting, Cuff Size: Normal)   Pulse (!) 36   Ht 5' (1.524 m)   Wt 152 lb (68.9 kg)   SpO2 100%   BMI 29.69 kg/m   Physical Exam Constitutional:      Appearance: Normal appearance.  HENT:     Head: Normocephalic and atraumatic.  Cardiovascular:     Rate and Rhythm: Normal rate and regular rhythm.  Pulmonary:     Effort: Pulmonary effort is normal.     Breath sounds: Normal breath sounds.  Musculoskeletal:     Cervical back: Neck supple.  Neurological:     Mental Status: He is alert.  Psychiatric:        Mood and Affect: Mood normal.        Behavior: Behavior normal.     ------------------------------------------------------------------------------------------------------------------------------------------------------------------------------------------------------------------- Assessment and Plan  Essential hypertension BP remains well controlled.  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  Continues to see cardiology.    Mixed hyperlipidemia Doing well with crestor at  current strength.  Will continue.   T2DM (type 2 diabetes mellitus) (HCC) Slight increase in A1c but overall remains well controlled.  Will continue metformin at current strength.  Encouraged dietary changes.    No orders of the defined types were placed in this encounter.   No follow-ups on file.    This visit occurred during the SARS-CoV-2 public health emergency.  Safety protocols were in place, including screening questions prior to the visit, additional usage of staff PPE, and extensive cleaning of exam room while observing appropriate contact time as indicated for disinfecting solutions.

## 2022-12-11 NOTE — Assessment & Plan Note (Signed)
Doing well with crestor at current strength.  Will continue.

## 2023-03-11 NOTE — Telephone Encounter (Signed)
Care team updated and letter sent for eye exam notes.

## 2023-03-20 ENCOUNTER — Ambulatory Visit (INDEPENDENT_AMBULATORY_CARE_PROVIDER_SITE_OTHER): Payer: Medicare HMO | Admitting: Family Medicine

## 2023-03-20 ENCOUNTER — Encounter: Payer: Self-pay | Admitting: Family Medicine

## 2023-03-20 VITALS — BP 160/84 | HR 32 | Ht 60.0 in | Wt 145.0 lb

## 2023-03-20 DIAGNOSIS — L570 Actinic keratosis: Secondary | ICD-10-CM

## 2023-03-20 DIAGNOSIS — N411 Chronic prostatitis: Secondary | ICD-10-CM

## 2023-03-20 LAB — POCT URINALYSIS DIP (CLINITEK)
Bilirubin, UA: NEGATIVE
Blood, UA: NEGATIVE
Glucose, UA: NEGATIVE mg/dL
Ketones, POC UA: NEGATIVE mg/dL
Nitrite, UA: NEGATIVE
POC PROTEIN,UA: NEGATIVE
Spec Grav, UA: 1.02 (ref 1.010–1.025)
Urobilinogen, UA: 0.2 U/dL
pH, UA: 7.5 (ref 5.0–8.0)

## 2023-03-20 MED ORDER — SULFAMETHOXAZOLE-TRIMETHOPRIM 800-160 MG PO TABS: 1.0000 | ORAL_TABLET | Freq: Two times a day (BID) | ORAL | 0 refills | Status: AC

## 2023-03-20 NOTE — Assessment & Plan Note (Signed)
Benign appearing keratosis on RLE.  He will let me know if this changes.

## 2023-03-20 NOTE — Progress Notes (Signed)
Barry Gonzalez - 72 y.o. male MRN [id number]  Date of birth: 04/14/1951  Subjective Chief Complaint  Patient presents with   Urinary Tract Infection    HPI Barry Gonzalez is a 72 y.o. male here today with complaint of possible UTI.  He has had urinary pain, frequency, and increased hesitancy.  He does have history of prostatitis and reports that symptoms feel similar.  He has not had fever or chills.    Also has small bump on his leg that he wants checked out.  He tried "popping" with a safety pin.    ROS:  A comprehensive ROS was completed and negative except as noted per HPI    Allergies  Allergen Reactions   Penicillins Nausea And Vomiting    Past Medical History:  Diagnosis Date   Allergy 07/31/2022   Due to pollen i have been sneezing, and having mucus running down my throat. I am taking over the counter medication for it.   Cataract    My eye doctor mentioned it about 8 years back   Diabetes mellitus without complication (HCC)    Glaucoma    I have been receiving treatment for high pressure in my eyes for some now.   Hypertension     History reviewed. No pertinent surgical history.  Social History   Socioeconomic History   Marital status: Married    Spouse name: Jemelle Jansma   Number of children: 5   Years of education: 18   Highest education level: Master's degree (e.g., MA, MS, MEng, MEd, MSW, MBA)  Occupational History   Occupation: Retired  Tobacco Use   Smoking status: Former    Current packs/day: 0.00    Average packs/day: 0.3 packs/day for 20.0 years (5.0 ttl pk-yrs)    Types: Cigarettes    Start date: 05/18/1981    Quit date: 05/18/2001    Years since quitting: 21.8   Smokeless tobacco: Never   Tobacco comments:    I stopped more than 20 years ago.  Vaping Use   Vaping status: Never Used  Substance and Sexual Activity   Alcohol use: Not Currently   Drug use: Never   Sexual activity: Yes    Partners: Female    Birth  control/protection: None  Other Topics Concern   Not on file  Social History Narrative   Lives with his wife. He had five children; one passed away last year 04-16-22). He enjoys watching movies and reading.   Social Determinants of Health   Financial Resource Strain: High Risk (03/19/2023)   Overall Financial Resource Strain (CARDIA)    Difficulty of Paying Living Expenses: Hard  Food Insecurity: Food Insecurity Present (03/19/2023)   Hunger Vital Sign    Worried About Running Out of Food in the Last Year: Sometimes true    Ran Out of Food in the Last Year: Never true  Transportation Needs: No Transportation Needs (03/19/2023)   PRAPARE - Administrator, Civil Service (Medical): No    Lack of Transportation (Non-Medical): No  Physical Activity: Sufficiently Active (03/19/2023)   Exercise Vital Sign    Days of Exercise per Week: 5 days    Minutes of Exercise per Session: 40 min  Stress: Stress Concern Present (03/19/2023)   [employer] of Occupational Health - Occupational Stress Questionnaire    Feeling of Stress : Rather much  Social Connections: Socially Isolated (03/19/2023)   Social Connection and Isolation Panel [NHANES]    Frequency of Communication with Friends and Family:  Once a week    Frequency of Social Gatherings with Friends and Family: Never    Attends Religious Services: Never    Database administrator or Organizations: No    Attends Engineer, structural: Never    Marital Status: Married    Family History  Problem Relation Age of Onset   Hypertension Other    Stroke Other    Hypertension Mother     Health Maintenance  Topic Date Due   Hepatitis C Screening  Never done   Colonoscopy  Never done   Zoster Vaccines- Shingrix (1 of 2) Never done   Pneumonia Vaccine 22+ Years old (2 of 2 - PPSV23 or PCV20) 06/06/2016   COVID-19 Vaccine (5 - 2023-24 season) 03/03/2023   OPHTHALMOLOGY EXAM  05/24/2023   HEMOGLOBIN A1C  06/13/2023    Diabetic kidney evaluation - eGFR measurement  06/15/2023   Diabetic kidney evaluation - Urine ACR  06/15/2023   Medicare Annual Wellness (AWV)  09/11/2023   FOOT EXAM  12/11/2023   INFLUENZA VACCINE  Completed   HPV VACCINES  Aged Out   DTaP/Tdap/Td  Discontinued     ----------------------------------------------------------------------------------------------------------------------------------------------------------------------------------------------------------------- Physical Exam BP (!) 160/84 (BP Location: Left Arm, Patient Position: Sitting, Cuff Size: Normal)   Pulse (!) 32   Ht 5' (1.524 m)   Wt 145 lb (65.8 kg)   SpO2 100%   BMI 28.32 kg/m   Physical Exam Constitutional:      Appearance: Normal appearance.  HENT:     Head: Normocephalic and atraumatic.  Cardiovascular:     Rate and Rhythm: Normal rate and regular rhythm.  Pulmonary:     Effort: Pulmonary effort is normal.     Breath sounds: Normal breath sounds.  Neurological:     Mental Status: He is alert.  Psychiatric:        Mood and Affect: Mood normal.        Behavior: Behavior normal.     ------------------------------------------------------------------------------------------------------------------------------------------------------------------------------------------------------------------- Assessment and Plan  Prostatitis Will treat empirically for prostatitis.  Urine sent for culture.  PSA ordered.  Red flags and precautions discussed.   Keratosis Benign appearing keratosis on RLE.  He will let me know if this changes.    Meds ordered this encounter  Medications   sulfamethoxazole-trimethoprim (BACTRIM DS) 800-160 MG tablet    Sig: Take 1 tablet by mouth 2 (two) times daily for 14 days.    Dispense:  28 tablet    Refill:  0    No follow-ups on file.    This visit occurred during the SARS-CoV-2 public health emergency.  Safety protocols were in place, including screening questions  prior to the visit, additional usage of staff PPE, and extensive cleaning of exam room while observing appropriate contact time as indicated for disinfecting solutions.

## 2023-03-20 NOTE — Assessment & Plan Note (Signed)
Will treat empirically for prostatitis.  Urine sent for culture.  PSA ordered.  Red flags and precautions discussed.

## 2023-03-21 LAB — PSA: Prostate Specific Ag, Serum: 12.1 ng/mL — ABNORMAL HIGH (ref 0.0–4.0)

## 2023-03-22 LAB — URINE CULTURE: Organism ID, Bacteria: NO GROWTH

## 2023-03-26 LAB — HM DIABETES EYE EXAM

## 2023-04-21 ENCOUNTER — Ambulatory Visit: Payer: Medicare HMO

## 2023-04-21 ENCOUNTER — Ambulatory Visit (INDEPENDENT_AMBULATORY_CARE_PROVIDER_SITE_OTHER): Payer: Medicare HMO | Admitting: Family Medicine

## 2023-04-21 ENCOUNTER — Encounter: Payer: Self-pay | Admitting: Family Medicine

## 2023-04-21 VITALS — BP 122/82 | HR 31 | Ht 60.0 in | Wt 151.0 lb

## 2023-04-21 DIAGNOSIS — R972 Elevated prostate specific antigen [PSA]: Secondary | ICD-10-CM

## 2023-04-21 DIAGNOSIS — R8290 Unspecified abnormal findings in urine: Secondary | ICD-10-CM | POA: Diagnosis not present

## 2023-04-21 DIAGNOSIS — M545 Low back pain, unspecified: Secondary | ICD-10-CM

## 2023-04-21 DIAGNOSIS — R109 Unspecified abdominal pain: Secondary | ICD-10-CM | POA: Insufficient documentation

## 2023-04-21 LAB — POCT URINALYSIS DIP (CLINITEK)
Bilirubin, UA: NEGATIVE
Blood, UA: NEGATIVE
Glucose, UA: NEGATIVE mg/dL
Ketones, POC UA: NEGATIVE mg/dL
Nitrite, UA: NEGATIVE
POC PROTEIN,UA: NEGATIVE
Spec Grav, UA: 1.015 (ref 1.010–1.025)
Urobilinogen, UA: 0.2 U/dL
pH, UA: 7 (ref 5.0–8.0)

## 2023-04-21 NOTE — Progress Notes (Signed)
Barry Gonzalez - 72 y.o. male MRN [id number]  Date of birth: [birth year]  Subjective Chief Complaint  Patient presents with   Flank Pain    HPI Barry Gonzalez is a 72 y.o. male here today with complaint of back and flank pain on the R side.  Recently treated for prostatitis and these symptoms have improved.  He still has some occasional frequency.  Denies dysuria or hematuria.  He has not had fever/chills.  PSA elevated to 12.1 previously. He has seen urology at [facility] previously.  Pain does not seem to worsen with movement.   ROS:  A comprehensive ROS was completed and negative except as noted per HPI  Allergies  Allergen Reactions   Penicillins Nausea And Vomiting    Past Medical History:  Diagnosis Date   Allergy 07/31/2022   Due to pollen i have been sneezing, and having mucus running down my throat. I am taking over the counter medication for it.   Cataract    My eye doctor mentioned it about 8 years back   Diabetes mellitus without complication (HCC)    Glaucoma    I have been receiving treatment for high pressure in my eyes for some now.   Hypertension     History reviewed. No pertinent surgical history.  Social History   Socioeconomic History   Marital status: Married    Spouse name: Bobak Pynes   Number of children: 5   Years of education: 18   Highest education level: Master's degree (e.g., MA, MS, MEng, MEd, MSW, MBA)  Occupational History   Occupation: Retired  Tobacco Use   Smoking status: Former    Current packs/day: 0.00    Average packs/day: 0.3 packs/day for 20.0 years (5.0 ttl pk-yrs)    Types: Cigarettes    Start date: 05/18/1981    Quit date: 05/18/2001    Years since quitting: 21.9   Smokeless tobacco: Never   Tobacco comments:    I stopped more than 20 years ago.  Vaping Use   Vaping status: Never Used  Substance and Sexual Activity   Alcohol use: Not Currently   Drug use: Never   Sexual activity: Yes    Partners: Female    Birth  control/protection: None  Other Topics Concern   Not on file  Social History Narrative   Lives with his wife. He had five children; one passed away last year 26-May-2022). He enjoys watching movies and reading.   Social Drivers of Corporate investment banker Strain: Low Risk  (04/18/2023)   Overall Financial Resource Strain (CARDIA)    Difficulty of Paying Living Expenses: Not very hard  Recent Concern: Financial Resource Strain - High Risk (03/19/2023)   Overall Financial Resource Strain (CARDIA)    Difficulty of Paying Living Expenses: Hard  Food Insecurity: No Food Insecurity (04/18/2023)   Hunger Vital Sign    Worried About Running Out of Food in the Last Year: Never true    Ran Out of Food in the Last Year: Never true  Recent Concern: Food Insecurity - Food Insecurity Present (03/19/2023)   Hunger Vital Sign    Worried About Running Out of Food in the Last Year: Sometimes true    Ran Out of Food in the Last Year: Never true  Transportation Needs: No Transportation Needs (04/18/2023)   PRAPARE - Administrator, Civil Service (Medical): No    Lack of Transportation (Non-Medical): No  Physical Activity: Insufficiently Active (04/18/2023)   Exercise Vital Sign  Days of Exercise per Week: 4 days    Minutes of Exercise per Session: 30 min  Stress: Stress Concern Present (04/18/2023)   [employer] of Occupational Health - Occupational Stress Questionnaire    Feeling of Stress : To some extent  Social Connections: Moderately Isolated (04/18/2023)   Social Connection and Isolation Panel [NHANES]    Frequency of Communication with Friends and Family: Once a week    Frequency of Social Gatherings with Friends and Family: Never    Attends Religious Services: 1 to 4 times per year    Active Member of Clubs or Organizations: No    Attends Banker Meetings: Never    Marital Status: Married    Family History  Problem Relation Age of Onset   Hypertension  Other    Stroke Other    Hypertension Mother     Health Maintenance  Topic Date Due   Hepatitis C Screening  Never done   Zoster Vaccines- Shingrix (1 of 2) Never done   Colonoscopy  Never done   COVID-19 Vaccine (4 - 2024-25 season) 03/03/2023   Pneumonia Vaccine 74+ Years old (2 of 2 - PPSV23 or PCV20) 04/20/2024 (Originally 08/02/2015)   OPHTHALMOLOGY EXAM  05/24/2023   HEMOGLOBIN A1C  06/13/2023   Diabetic kidney evaluation - eGFR measurement  06/15/2023   Diabetic kidney evaluation - Urine ACR  06/15/2023   Medicare Annual Wellness (AWV)  09/11/2023   FOOT EXAM  12/11/2023   INFLUENZA VACCINE  Completed   HPV VACCINES  Aged Out   DTaP/Tdap/Td  Discontinued     ----------------------------------------------------------------------------------------------------------------------------------------------------------------------------------------------------------------- Physical Exam BP 122/82 (BP Location: Left Arm, Patient Position: Sitting, Cuff Size: Normal)   Pulse (!) 31   Ht 5' (1.524 m)   Wt 151 lb (68.5 kg)   SpO2 100%   BMI 29.49 kg/m   Physical Exam Constitutional:      Appearance: Normal appearance.  Cardiovascular:     Rate and Rhythm: Normal rate and regular rhythm.  Pulmonary:     Effort: Pulmonary effort is normal.     Breath sounds: Normal breath sounds.  Abdominal:     General: Bowel sounds are normal. There is no distension.     Palpations: Abdomen is soft.     Tenderness: There is no right CVA tenderness or left CVA tenderness.  Neurological:     General: No focal deficit present.     Mental Status: He is alert.  Psychiatric:        Mood and Affect: Mood normal.     ------------------------------------------------------------------------------------------------------------------------------------------------------------------------------------------------------------------- Assessment and Plan  Flank pain KUB and lumbar spine xrays ordered.   Update PSA.  UA with some leukocytes sent for culture.    No orders of the defined types were placed in this encounter.   No follow-ups on file.    This visit occurred during the SARS-CoV-2 public health emergency.  Safety protocols were in place, including screening questions prior to the visit, additional usage of staff PPE, and extensive cleaning of exam room while observing appropriate contact time as indicated for disinfecting solutions.

## 2023-04-21 NOTE — Assessment & Plan Note (Signed)
KUB and lumbar spine xrays ordered.  Update PSA.  UA with some leukocytes sent for culture.

## 2023-04-22 LAB — PSA, TOTAL AND FREE
PSA, Free Pct: 8.3 %
PSA, Free: 1.09 ng/mL
Prostate Specific Ag, Serum: 13.1 ng/mL — ABNORMAL HIGH (ref 0.0–4.0)

## 2023-04-24 LAB — URINE CULTURE: Organism ID, Bacteria: NO GROWTH

## 2023-06-07 ENCOUNTER — Other Ambulatory Visit: Payer: Self-pay | Admitting: Family Medicine

## 2023-06-13 ENCOUNTER — Ambulatory Visit (INDEPENDENT_AMBULATORY_CARE_PROVIDER_SITE_OTHER): Payer: Medicare HMO | Admitting: Family Medicine

## 2023-06-13 ENCOUNTER — Encounter: Payer: Self-pay | Admitting: Family Medicine

## 2023-06-13 VITALS — BP 118/62 | HR 34 | Ht 64.0 in | Wt 145.0 lb

## 2023-06-13 DIAGNOSIS — E119 Type 2 diabetes mellitus without complications: Secondary | ICD-10-CM | POA: Diagnosis not present

## 2023-06-13 DIAGNOSIS — E782 Mixed hyperlipidemia: Secondary | ICD-10-CM | POA: Diagnosis not present

## 2023-06-13 DIAGNOSIS — I10 Essential (primary) hypertension: Secondary | ICD-10-CM | POA: Diagnosis not present

## 2023-06-13 DIAGNOSIS — Z7984 Long term (current) use of oral hypoglycemic drugs: Secondary | ICD-10-CM

## 2023-06-13 LAB — POCT GLYCOSYLATED HEMOGLOBIN (HGB A1C): HbA1c, POC (controlled diabetic range): 6.1 % (ref 0.0–7.0)

## 2023-06-13 MED ORDER — METFORMIN HCL 500 MG PO TABS: 500.0000 mg | ORAL_TABLET | Freq: Two times a day (BID) | ORAL | 3 refills | Status: DC

## 2023-06-13 MED ORDER — RAMIPRIL 10 MG PO CAPS: 10.0000 mg | ORAL_CAPSULE | Freq: Every day | ORAL | 3 refills | Status: AC

## 2023-06-13 MED ORDER — AMLODIPINE BESYLATE 5 MG PO TABS: 5.0000 mg | ORAL_TABLET | Freq: Every day | ORAL | 3 refills | Status: AC

## 2023-06-13 MED ORDER — ROSUVASTATIN CALCIUM 10 MG PO TABS: 10.0000 mg | ORAL_TABLET | Freq: Every day | ORAL | 3 refills | Status: DC

## 2023-06-13 NOTE — Assessment & Plan Note (Signed)
Blood sugars are well controlled.  Will continue metformin at current strength.  Encouraged dietary changes.

## 2023-06-13 NOTE — Assessment & Plan Note (Signed)
Doing well with crestor at current strength.  Will continue.

## 2023-06-13 NOTE — Assessment & Plan Note (Signed)
BP remains well controlled.  Continue current medications for management of HTN.

## 2023-06-13 NOTE — Progress Notes (Signed)
Barry Gonzalez - 73 y.o. male MRN [id number]  Date of birth: 09/29/1950  Subjective Chief Complaint  Patient presents with   Medical Management of Chronic Issues    HPI Barry Gonzalez is a 73 y.o. male here today for follow up visit.   He reports that he is doing well.   Continues on amlodipine and ramipril for management of HTN.  BP is well controlled at this time.  Denies side effects from medication at current strength.  No chest pain, shortness of breath, palpitations, headache or vision changes.   Diabetes is well controlled with metformin.  Tolerating well.  A1c is 6.1% today.  Tolerating crestor well at current strength.   ROS:  A comprehensive ROS was completed and negative except as noted per HPI  Allergies  Allergen Reactions   Penicillins Nausea And Vomiting    Past Medical History:  Diagnosis Date   Allergy 07/31/2022   Due to pollen i have been sneezing, and having mucus running down my throat. I am taking over the counter medication for it.   Cataract    My eye doctor mentioned it about 8 years back   Diabetes mellitus without complication (HCC)    Glaucoma    I have been receiving treatment for high pressure in my eyes for some now.   Hypertension     History reviewed. No pertinent surgical history.  Social History   Socioeconomic History   Marital status: Married    Spouse name: Gurley Climer   Number of children: 5   Years of education: 18   Highest education level: Master's degree (e.g., MA, MS, MEng, MEd, MSW, MBA)  Occupational History   Occupation: Retired  Tobacco Use   Smoking status: Former    Current packs/day: 0.00    Average packs/day: 0.3 packs/day for 20.0 years (5.0 ttl pk-yrs)    Types: Cigarettes    Start date: 05/18/1981    Quit date: 05/18/2001    Years since quitting: 22.0   Smokeless tobacco: Never   Tobacco comments:    I stopped more than 20 years ago.  Vaping Use   Vaping status: Never Used  Substance and Sexual  Activity   Alcohol use: Not Currently   Drug use: Never   Sexual activity: Yes    Partners: Female    Birth control/protection: None  Other Topics Concern   Not on file  Social History Narrative   Lives with his wife. He had five children; one passed away last year 07-Jul-2021). He enjoys watching movies and reading.   Social Drivers of Corporate investment banker Strain: Low Risk  (04/18/2023)   Overall Financial Resource Strain (CARDIA)    Difficulty of Paying Living Expenses: Not very hard  Recent Concern: Financial Resource Strain - High Risk (03/19/2023)   Overall Financial Resource Strain (CARDIA)    Difficulty of Paying Living Expenses: Hard  Food Insecurity: No Food Insecurity (04/18/2023)   Hunger Vital Sign    Worried About Running Out of Food in the Last Year: Never true    Ran Out of Food in the Last Year: Never true  Recent Concern: Food Insecurity - Food Insecurity Present (03/19/2023)   Hunger Vital Sign    Worried About Running Out of Food in the Last Year: Sometimes true    Ran Out of Food in the Last Year: Never true  Transportation Needs: No Transportation Needs (04/18/2023)   PRAPARE - Administrator, Civil Service (Medical): No  Lack of Transportation (Non-Medical): No  Physical Activity: Insufficiently Active (04/18/2023)   Exercise Vital Sign    Days of Exercise per Week: 4 days    Minutes of Exercise per Session: 30 min  Stress: Stress Concern Present (04/18/2023)   [employer] of Occupational Health - Occupational Stress Questionnaire    Feeling of Stress : To some extent  Social Connections: Moderately Isolated (04/18/2023)   Social Connection and Isolation Panel [NHANES]    Frequency of Communication with Friends and Family: Once a week    Frequency of Social Gatherings with Friends and Family: Never    Attends Religious Services: 1 to 4 times per year    Active Member of [employer] or Organizations: No    Attends Banker  Meetings: Never    Marital Status: Married    Family History  Problem Relation Age of Onset   Hypertension Other    Stroke Other    Hypertension Mother     Health Maintenance  Topic Date Due   Hepatitis C Screening  Never done   OPHTHALMOLOGY EXAM  05/24/2023   Diabetic kidney evaluation - eGFR measurement  06/15/2023   Diabetic kidney evaluation - Urine ACR  06/15/2023   COVID-19 Vaccine (4 - 2024-25 season) 10/28/2023 (Originally 03/03/2023)   Zoster Vaccines- Shingrix (1 of 2) 11/10/2023 (Originally 06/05/1969)   Pneumonia Vaccine 12+ Years old (2 of 2 - PPSV23 or PCV20) 04/20/2024 (Originally 08/02/2015)   Colonoscopy  06/12/2024 (Originally 06/06/1995)   Medicare Annual Wellness (AWV)  09/11/2023   FOOT EXAM  12/11/2023   HEMOGLOBIN A1C  12/11/2023   INFLUENZA VACCINE  Completed   HPV VACCINES  Aged Out   DTaP/Tdap/Td  Discontinued     ----------------------------------------------------------------------------------------------------------------------------------------------------------------------------------------------------------------- Physical Exam BP 118/62 (BP Location: Left Arm, Patient Position: Sitting, Cuff Size: Normal)   Pulse (!) 34   Ht 5\' 4"  (1.626 m)   Wt 145 lb (65.8 kg)   SpO2 99%   BMI 24.89 kg/m   Physical Exam Constitutional:      Appearance: Normal appearance.  HENT:     Head: Normocephalic and atraumatic.  Eyes:     General: No scleral icterus. Cardiovascular:     Rate and Rhythm: Normal rate and regular rhythm.  Pulmonary:     Effort: Pulmonary effort is normal.     Breath sounds: Normal breath sounds.  Neurological:     Mental Status: He is alert.  Psychiatric:        Mood and Affect: Mood normal.        Behavior: Behavior normal.      ------------------------------------------------------------------------------------------------------------------------------------------------------------------------------------------------------------------- Assessment and Plan  T2DM (type 2 diabetes mellitus) (HCC) Blood sugars are well controlled.  Will continue metformin at current strength.  Encouraged dietary changes.   Essential hypertension BP remains well controlled.  Continue current medications for management of HTN  Mixed hyperlipidemia Doing well with crestor at current strength.  Will continue.    Meds ordered this encounter  Medications   amLODipine (NORVASC) 5 MG tablet    Sig: Take 1 tablet (5 mg total) by mouth daily.    Dispense:  90 tablet    Refill:  3   metFORMIN (GLUCOPHAGE) 500 MG tablet    Sig: Take 1 tablet (500 mg total) by mouth 2 (two) times daily with a meal.    Dispense:  180 tablet    Refill:  3   ramipril (ALTACE) 10 MG capsule    Sig: Take 1 capsule (10 mg total)  by mouth daily.    Dispense:  90 capsule    Refill:  3   rosuvastatin (CRESTOR) 10 MG tablet    Sig: Take 1 tablet (10 mg total) by mouth daily.    Dispense:  90 tablet    Refill:  3    Return in about 6 months (around 12/11/2023) for Hypertension, Type 2 Diabetes.    This visit occurred during the SARS-CoV-2 public health emergency.  Safety protocols were in place, including screening questions prior to the visit, additional usage of staff PPE, and extensive cleaning of exam room while observing appropriate contact time as indicated for disinfecting solutions.

## 2023-06-16 LAB — POCT UA - MICROALBUMIN
Creatinine, POC: 200 mg/dL
Microalbumin Ur, POC: 80 mg/L

## 2023-06-16 NOTE — Addendum Note (Signed)
Addended by: Ardyth Man on: 06/16/2023 08:19 AM   Modules accepted: Orders

## 2023-12-12 ENCOUNTER — Encounter: Payer: Self-pay | Admitting: Family Medicine

## 2023-12-12 ENCOUNTER — Ambulatory Visit (INDEPENDENT_AMBULATORY_CARE_PROVIDER_SITE_OTHER): Payer: Medicare HMO | Admitting: Family Medicine

## 2023-12-12 VITALS — BP 130/80 | HR 31 | Wt 148.0 lb

## 2023-12-12 DIAGNOSIS — I10 Essential (primary) hypertension: Secondary | ICD-10-CM

## 2023-12-12 DIAGNOSIS — E119 Type 2 diabetes mellitus without complications: Secondary | ICD-10-CM

## 2023-12-12 DIAGNOSIS — E782 Mixed hyperlipidemia: Secondary | ICD-10-CM | POA: Diagnosis not present

## 2023-12-12 DIAGNOSIS — R001 Bradycardia, unspecified: Secondary | ICD-10-CM | POA: Diagnosis not present

## 2023-12-12 DIAGNOSIS — Z7984 Long term (current) use of oral hypoglycemic drugs: Secondary | ICD-10-CM

## 2023-12-12 LAB — POCT GLYCOSYLATED HEMOGLOBIN (HGB A1C): Hemoglobin A1C: 6 % — AB (ref 4.0–5.6)

## 2023-12-12 NOTE — Assessment & Plan Note (Signed)
 Blood sugars are well controlled.  Will continue metformin at current strength.  Encouraged dietary changes.

## 2023-12-12 NOTE — Progress Notes (Signed)
 Barry Gonzalez - 73 y.o. male MRN [id number]  Date of birth: 14-Aug-1950  Subjective Chief Complaint  Patient presents with   Diabetes    HPI Barry Gonzalez is a 73 y.o. male here today for follow up visit.   He reports that he is doing ok.   He continues on metformin  for management of diabetes.  Reports that he continues to do well with this at current strength.  His A1c today is 6.0%.  BP remains well controlled.  He has history of bradycardia.  He has been seen by cardiology in the past with recommended monitoring as he has been asymptomatic.  He has not had chest pain, shortness of breath, fatigue, pre-syncope/syncope, or dizziness.  ROS:  A comprehensive ROS was completed and negative except as noted per HPI  Allergies  Allergen Reactions   Penicillins Nausea And Vomiting    Past Medical History:  Diagnosis Date   Allergy 07/31/2022   Due to pollen i have been sneezing, and having mucus running down my throat. I am taking over the counter medication for it.   Cataract    My eye doctor mentioned it about 8 years back   Diabetes mellitus without complication (HCC)    Glaucoma    I have been receiving treatment for high pressure in my eyes for some now.   Hypertension     No past surgical history on file.  Social History   Socioeconomic History   Marital status: Married    Spouse name: Mazen Marcin   Number of children: 5   Years of education: 18   Highest education level: Master's degree (e.g., MA, MS, MEng, MEd, MSW, MBA)  Occupational History   Occupation: Retired  Tobacco Use   Smoking status: Former    Current packs/day: 0.00    Average packs/day: 0.3 packs/day for 20.0 years (5.0 ttl pk-yrs)    Types: Cigarettes    Start date: 05/18/1981    Quit date: 05/18/2001    Years since quitting: 22.5   Smokeless tobacco: Never   Tobacco comments:    I stopped more than 20 years ago.  Vaping Use   Vaping status: Never Used  Substance and Sexual Activity    Alcohol use: Not Currently   Drug use: Never   Sexual activity: Yes    Partners: Female    Birth control/protection: None  Other Topics Concern   Not on file  Social History Narrative   Lives with his wife. He had five children; one passed away last year 01-19-2022). He enjoys watching movies and reading.   Social Drivers of Health   Financial Resource Strain: Medium Risk (12/08/2023)   Overall Financial Resource Strain (CARDIA)    Difficulty of Paying Living Expenses: Somewhat hard  Food Insecurity: No Food Insecurity (12/08/2023)   Hunger Vital Sign    Worried About Running Out of Food in the Last Year: Never true    Ran Out of Food in the Last Year: Never true  Transportation Needs: No Transportation Needs (12/08/2023)   PRAPARE - Administrator, Civil Service (Medical): No    Lack of Transportation (Non-Medical): No  Physical Activity: Sufficiently Active (12/08/2023)   Exercise Vital Sign    Days of Exercise per Week: 5 days    Minutes of Exercise per Session: 30 min  Stress: Stress Concern Present (12/08/2023)   [employer] of Occupational Health - Occupational Stress Questionnaire    Feeling of Stress: To some extent  Social Connections:  Socially Isolated (12/08/2023)   Social Connection and Isolation Panel    Frequency of Communication with Friends and Family: Twice a week    Frequency of Social Gatherings with Friends and Family: Never    Attends Religious Services: Never    Database administrator or Organizations: No    Attends Engineer, structural: Not on file    Marital Status: Married    Family History  Problem Relation Age of Onset   Hypertension Other    Stroke Other    Hypertension Mother     Health Maintenance  Topic Date Due   Hepatitis C Screening  Never done   Zoster Vaccines- Shingrix (1 of 2) Never done   COVID-19 Vaccine (4 - 2024-25 season) 03/03/2023   OPHTHALMOLOGY EXAM  05/24/2023   Diabetic kidney evaluation - eGFR  measurement  06/15/2023   Medicare Annual Wellness (AWV)  09/11/2023   INFLUENZA VACCINE  11/28/2023   Pneumococcal Vaccine: 50+ Years (2 of 2 - PPSV23, PCV20, or PCV21) 04/20/2024 (Originally 08/02/2015)   Colonoscopy  06/12/2024 (Originally 06/06/1995)   HEMOGLOBIN A1C  06/13/2024   Diabetic kidney evaluation - Urine ACR  06/15/2024   FOOT EXAM  12/11/2024   HPV VACCINES  Aged Out   Meningococcal B Vaccine  Aged Out   DTaP/Tdap/Td  Discontinued   Pneumococcal Vaccine  Discontinued     ----------------------------------------------------------------------------------------------------------------------------------------------------------------------------------------------------------------- Physical Exam BP 130/80   Pulse (!) 31   Wt 148 lb (67.1 kg)   SpO2 100%   BMI 25.40 kg/m   Physical Exam Constitutional:      Appearance: Normal appearance.  Eyes:     General: No scleral icterus. Cardiovascular:     Rate and Rhythm: Normal rate and regular rhythm.  Pulmonary:     Effort: Pulmonary effort is normal.     Breath sounds: Normal breath sounds.  Neurological:     Mental Status: He is alert.  Psychiatric:        Mood and Affect: Mood normal.        Behavior: Behavior normal.     ------------------------------------------------------------------------------------------------------------------------------------------------------------------------------------------------------------------- Assessment and Plan  T2DM (type 2 diabetes mellitus) (HCC) Blood sugars are well controlled.  Will continue metformin  at current strength.  Encouraged dietary changes.   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  Completely asymptomatic.  Discussed symptoms and red flags.  Also followed by cardiology.   Essential hypertension BP remains well controlled.  Continue current medications for management of HTN  Mixed hyperlipidemia Doing well with crestor  at current strength.  Will continue.     No orders of the defined types were placed in this encounter.   Return in about 6 months (around 06/13/2024) for Hypertension, Type 2 Diabetes.

## 2023-12-12 NOTE — Assessment & Plan Note (Signed)
 Doing well with crestor  at current strength.  Will continue.

## 2023-12-12 NOTE — Assessment & Plan Note (Signed)
 BP remains well controlled.  Continue current medications for management of HTN.

## 2023-12-12 NOTE — Assessment & Plan Note (Signed)
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  Completely asymptomatic.  Discussed symptoms and red flags.  Also followed by cardiology.

## 2023-12-13 LAB — CMP14+EGFR
ALT: 13 IU/L (ref 0–44)
AST: 16 IU/L (ref 0–40)
Albumin: 4.4 g/dL (ref 3.8–4.8)
Alkaline Phosphatase: 39 IU/L — ABNORMAL LOW (ref 44–121)
BUN/Creatinine Ratio: 10 (ref 10–24)
BUN: 11 mg/dL (ref 8–27)
Bilirubin Total: 0.6 mg/dL (ref 0.0–1.2)
CO2: 22 mmol/L (ref 20–29)
Calcium: 9.9 mg/dL (ref 8.6–10.2)
Chloride: 106 mmol/L (ref 96–106)
Creatinine, Ser: 1.14 mg/dL (ref 0.76–1.27)
Globulin, Total: 2.5 g/dL (ref 1.5–4.5)
Glucose: 100 mg/dL — ABNORMAL HIGH (ref 70–99)
Potassium: 3.8 mmol/L (ref 3.5–5.2)
Sodium: 141 mmol/L (ref 134–144)
Total Protein: 6.9 g/dL (ref 6.0–8.5)
eGFR: 68 mL/min/1.73 (ref >59)

## 2023-12-13 LAB — CBC WITH DIFFERENTIAL/PLATELET
Basophils Absolute: 0 x10E3/uL (ref 0.0–0.2)
Basos: 1 %
EOS (ABSOLUTE): 0.1 x10E3/uL (ref 0.0–0.4)
Eos: 3 %
Hematocrit: 38.6 % (ref 37.5–51.0)
Hemoglobin: 12.5 g/dL — ABNORMAL LOW (ref 13.0–17.7)
Immature Grans (Abs): 0 x10E3/uL (ref 0.0–0.1)
Immature Granulocytes: 0 %
Lymphocytes Absolute: 1.5 x10E3/uL (ref 0.7–3.1)
Lymphs: 41 %
MCH: 30.3 pg (ref 26.6–33.0)
MCHC: 32.4 g/dL (ref 31.5–35.7)
MCV: 94 fL (ref 79–97)
Monocytes Absolute: 0.6 x10E3/uL (ref 0.1–0.9)
Monocytes: 16 %
Neutrophils Absolute: 1.4 x10E3/uL (ref 1.4–7.0)
Neutrophils: 39 %
Platelets: 156 x10E3/uL (ref 150–450)
RBC: 4.13 x10E6/uL — ABNORMAL LOW (ref 4.14–5.80)
RDW: 15.1 % (ref 11.6–15.4)
WBC: 3.6 x10E3/uL (ref 3.4–10.8)

## 2023-12-13 LAB — LIPID PANEL WITH LDL/HDL RATIO
Cholesterol, Total: 204 mg/dL — ABNORMAL HIGH (ref 100–199)
HDL: 70 mg/dL (ref >39)
LDL Chol Calc (NIH): 127 mg/dL — ABNORMAL HIGH (ref 0–99)
LDL/HDL Ratio: 1.8 ratio (ref 0.0–3.6)
Triglycerides: 38 mg/dL (ref 0–149)
VLDL Cholesterol Cal: 7 mg/dL (ref 5–40)

## 2023-12-26 ENCOUNTER — Ambulatory Visit: Payer: Self-pay | Admitting: Family Medicine

## 2024-04-09 ENCOUNTER — Other Ambulatory Visit: Payer: Self-pay | Admitting: Family Medicine

## 2024-04-09 DIAGNOSIS — E782 Mixed hyperlipidemia: Secondary | ICD-10-CM

## 2024-04-20 ENCOUNTER — Telehealth: Payer: Self-pay

## 2024-04-20 DIAGNOSIS — E782 Mixed hyperlipidemia: Secondary | ICD-10-CM

## 2024-04-20 NOTE — Telephone Encounter (Signed)
 Copied from CRM #[id number]. Topic: Clinical - Prescription Issue >> Apr 20, 2024  4:33 PM Wess RAMAN wrote: Reason for CRM: Patient needs rosuvastatin  (CRESTOR ) 20 MG tablet sent to pharmacy instead of the 10 MG tablet. He has been out for a month  Callback #: [id number]  Pharmacy: [id number] - [place], [place] - [address] [place] [address] Phone: [phone] Fax: [phone] Hours: Not open 24 hours

## 2024-04-23 ENCOUNTER — Other Ambulatory Visit: Payer: Self-pay | Admitting: Medical-Surgical

## 2024-04-23 DIAGNOSIS — E782 Mixed hyperlipidemia: Secondary | ICD-10-CM

## 2024-04-23 MED ORDER — ROSUVASTATIN CALCIUM 20 MG PO TABS: 20.0000 mg | ORAL_TABLET | Freq: Every day | ORAL | 0 refills | Status: AC

## 2024-04-23 NOTE — Telephone Encounter (Signed)
 Crestor  20mg  daily sent to the pharmacy as requested.  ___________________________________________ Zada FREDRIK Palin, DNP, APRN, FNP-BC Primary Care and Sports Medicine [facility]

## 2024-05-26 ENCOUNTER — Other Ambulatory Visit: Payer: Self-pay | Admitting: Family Medicine

## 2024-05-26 NOTE — Telephone Encounter (Signed)
 Requesting rx rf of Metformin  500mg  Last written 06/13/2023 Last OV 12/12/2023 Upcoming appt  06/18/2024 Refilled per protocol as 30 days until upcoming appt

## 2024-06-18 ENCOUNTER — Ambulatory Visit: Admitting: Family Medicine
